# Patient Record
Sex: Female | Born: 1990 | Race: White | Hispanic: No | Marital: Single | Smoking: Never smoker
Health system: Southern US, Community
[De-identification: ages and names within clinical notes are randomized; demographics above are authoritative.]

## PROBLEM LIST (undated history)

## (undated) DIAGNOSIS — Z8619 Personal history of other infectious and parasitic diseases: Secondary | ICD-10-CM

## (undated) DIAGNOSIS — Z789 Other specified health status: Secondary | ICD-10-CM

## (undated) DIAGNOSIS — O009 Unspecified ectopic pregnancy without intrauterine pregnancy: Secondary | ICD-10-CM

## (undated) DIAGNOSIS — O09299 Supervision of pregnancy with other poor reproductive or obstetric history, unspecified trimester: Secondary | ICD-10-CM

## (undated) HISTORY — DX: Supervision of pregnancy with other poor reproductive or obstetric history, unspecified trimester: O09.299

## (undated) HISTORY — DX: Personal history of other infectious and parasitic diseases: Z86.19

## (undated) HISTORY — PX: WISDOM TOOTH EXTRACTION: SHX21

---

## 2012-06-08 ENCOUNTER — Emergency Department: Payer: Self-pay | Admitting: Emergency Medicine

## 2012-06-08 LAB — CBC WITH DIFFERENTIAL/PLATELET
Basophil %: 0.4 %
Eosinophil %: 0.6 %
HCT: 46.7 % (ref 35.0–47.0)
HGB: 16 g/dL (ref 12.0–16.0)
Lymphocyte %: 30.4 %
MCH: 31.6 pg (ref 26.0–34.0)
Neutrophil %: 62.4 %
RBC: 5.06 10*6/uL (ref 3.80–5.20)
RDW: 15.1 % — ABNORMAL HIGH (ref 11.5–14.5)
WBC: 8.5 10*3/uL (ref 3.6–11.0)

## 2012-06-08 LAB — COMPREHENSIVE METABOLIC PANEL
Albumin: 4.5 g/dL (ref 3.8–5.6)
Alkaline Phosphatase: 90 U/L (ref 82–169)
Bilirubin,Total: 0.3 mg/dL (ref 0.2–1.0)
Co2: 26 mmol/L (ref 21–32)
Creatinine: 0.72 mg/dL (ref 0.60–1.30)
EGFR (Non-African Amer.): >60
Osmolality: 285 (ref 275–301)
Potassium: 4 mmol/L (ref 3.5–5.1)
SGPT (ALT): 21 U/L (ref 12–78)
Sodium: 144 mmol/L (ref 136–145)

## 2012-06-08 LAB — WET PREP, GENITAL

## 2012-06-08 LAB — RAPID HIV-1/2 QL/CONFIRM: HIV-1/2,Rapid Ql: NEGATIVE

## 2013-09-11 NOTE — L&D Delivery Note (Signed)
Delivery Note At 9:44 PM a viable and healthy female was delivered via Vaginal, Spontaneous Delivery (Presentation: Right Occiput Anterior).  APGAR: 7, 8; weight 8 lb 13.1 oz (4000 g).   Placenta status: Intact, Spontaneous.  Cord: 3 vessels with the following complications:   Anesthesia: Epidural  Episiotomy: None Lacerations: None Suture Repair: None needed Est. Blood Loss (mL): 200  Mom to postpartum.  Baby to Couplet care / Skin to Skin.  Maurie Boettcherhompson, McKenzie L 08/14/2014, 10:29 PM

## 2013-12-28 ENCOUNTER — Emergency Department: Payer: Self-pay | Admitting: Emergency Medicine

## 2013-12-28 LAB — URINALYSIS, COMPLETE
Bilirubin,UR: NEGATIVE
Blood: NEGATIVE
GLUCOSE, UR: NEGATIVE mg/dL (ref 0–75)
Ketone: NEGATIVE
LEUKOCYTE ESTERASE: NEGATIVE
NITRITE: NEGATIVE
PROTEIN: NEGATIVE
Ph: 7 (ref 4.5–8.0)
RBC,UR: NONE SEEN /HPF (ref 0–5)
Specific Gravity: 1.013 (ref 1.003–1.030)
Squamous Epithelial: 1

## 2013-12-28 LAB — COMPREHENSIVE METABOLIC PANEL
ALK PHOS: 47 U/L
Albumin: 3.7 g/dL (ref 3.4–5.0)
Anion Gap: 4 — ABNORMAL LOW (ref 7–16)
BILIRUBIN TOTAL: 0.3 mg/dL (ref 0.2–1.0)
BUN: 13 mg/dL (ref 7–18)
CALCIUM: 8.8 mg/dL (ref 8.5–10.1)
CO2: 26 mmol/L (ref 21–32)
Chloride: 105 mmol/L (ref 98–107)
Creatinine: 0.49 mg/dL — ABNORMAL LOW (ref 0.60–1.30)
EGFR (African American): >60
EGFR (Non-African Amer.): >60
Glucose: 80 mg/dL (ref 65–99)
Osmolality: 269 (ref 275–301)
Potassium: 3.9 mmol/L (ref 3.5–5.1)
SGOT(AST): 16 U/L (ref 15–37)
SGPT (ALT): 20 U/L (ref 12–78)
SODIUM: 135 mmol/L — AB (ref 136–145)
Total Protein: 7 g/dL (ref 6.4–8.2)

## 2013-12-28 LAB — HCG, QUANTITATIVE, PREGNANCY: Beta Hcg, Quant.: 90452 m[IU]/mL — ABNORMAL HIGH

## 2013-12-28 LAB — CBC
HCT: 39.9 % (ref 35.0–47.0)
HGB: 13.1 g/dL (ref 12.0–16.0)
MCH: 31.6 pg (ref 26.0–34.0)
MCHC: 32.8 g/dL (ref 32.0–36.0)
MCV: 96 fL (ref 80–100)
Platelet: 201 10*3/uL (ref 150–440)
RBC: 4.15 10*6/uL (ref 3.80–5.20)
RDW: 12.7 % (ref 11.5–14.5)
WBC: 9.7 10*3/uL (ref 3.6–11.0)

## 2013-12-28 LAB — GC/CHLAMYDIA PROBE AMP

## 2013-12-28 LAB — LIPASE, BLOOD: Lipase: 102 U/L (ref 73–393)

## 2013-12-28 LAB — WET PREP, GENITAL

## 2014-01-19 LAB — OB RESULTS CONSOLE ABO/RH: RH TYPE: POSITIVE

## 2014-01-19 LAB — OB RESULTS CONSOLE ANTIBODY SCREEN: Antibody Screen: NEGATIVE

## 2014-01-19 LAB — OB RESULTS CONSOLE VARICELLA ZOSTER ANTIBODY, IGG: Varicella: IMMUNE

## 2014-01-19 LAB — OB RESULTS CONSOLE HEPATITIS B SURFACE ANTIGEN: Hepatitis B Surface Ag: NEGATIVE

## 2014-01-19 LAB — OB RESULTS CONSOLE GC/CHLAMYDIA
Chlamydia: NEGATIVE
Gonorrhea: NEGATIVE

## 2014-01-19 LAB — OB RESULTS CONSOLE RPR: RPR: NONREACTIVE

## 2014-01-19 LAB — OB RESULTS CONSOLE HIV ANTIBODY (ROUTINE TESTING): HIV: NONREACTIVE

## 2014-01-19 LAB — OB RESULTS CONSOLE RUBELLA ANTIBODY, IGM: RUBELLA: NON-IMMUNE/NOT IMMUNE

## 2014-01-20 ENCOUNTER — Other Ambulatory Visit (HOSPITAL_COMMUNITY): Payer: Self-pay | Admitting: Physician Assistant

## 2014-01-20 DIAGNOSIS — Z3682 Encounter for antenatal screening for nuchal translucency: Secondary | ICD-10-CM

## 2014-01-27 ENCOUNTER — Other Ambulatory Visit: Payer: Self-pay

## 2014-01-27 ENCOUNTER — Ambulatory Visit (HOSPITAL_COMMUNITY)
Admission: RE | Admit: 2014-01-27 | Discharge: 2014-01-27 | Disposition: A | Discharge status: Home / Self Care | Payer: Medicaid Other | Source: Ambulatory Visit | Attending: Physician Assistant | Admitting: Physician Assistant

## 2014-01-27 ENCOUNTER — Encounter (HOSPITAL_COMMUNITY): Payer: Self-pay

## 2014-01-27 DIAGNOSIS — Z3682 Encounter for antenatal screening for nuchal translucency: Secondary | ICD-10-CM

## 2014-01-27 DIAGNOSIS — Z36 Encounter for antenatal screening of mother: Secondary | ICD-10-CM | POA: Insufficient documentation

## 2014-02-11 ENCOUNTER — Other Ambulatory Visit (HOSPITAL_COMMUNITY): Payer: Self-pay | Admitting: Family

## 2014-02-11 DIAGNOSIS — Z3689 Encounter for other specified antenatal screening: Secondary | ICD-10-CM

## 2014-03-16 ENCOUNTER — Ambulatory Visit (HOSPITAL_COMMUNITY)
Admission: RE | Admit: 2014-03-16 | Discharge: 2014-03-16 | Disposition: A | Discharge status: Home / Self Care | Payer: Medicaid Other | Source: Ambulatory Visit | Attending: Family | Admitting: Family

## 2014-03-16 DIAGNOSIS — Z3689 Encounter for other specified antenatal screening: Secondary | ICD-10-CM | POA: Diagnosis not present

## 2014-03-19 ENCOUNTER — Other Ambulatory Visit (HOSPITAL_COMMUNITY): Payer: Self-pay | Admitting: Nurse Practitioner

## 2014-03-19 DIAGNOSIS — O283 Abnormal ultrasonic finding on antenatal screening of mother: Secondary | ICD-10-CM

## 2014-04-14 ENCOUNTER — Ambulatory Visit (HOSPITAL_COMMUNITY)
Admission: RE | Admit: 2014-04-14 | Discharge: 2014-04-14 | Disposition: A | Discharge status: Home / Self Care | Payer: Medicaid Other | Source: Ambulatory Visit | Attending: Nurse Practitioner | Admitting: Nurse Practitioner

## 2014-04-14 ENCOUNTER — Encounter (HOSPITAL_COMMUNITY): Payer: Self-pay

## 2014-04-14 VITALS — BP 116/73 | HR 73 | Wt 130.0 lb

## 2014-04-14 DIAGNOSIS — O289 Unspecified abnormal findings on antenatal screening of mother: Secondary | ICD-10-CM | POA: Diagnosis present

## 2014-04-14 DIAGNOSIS — IMO0002 Reserved for concepts with insufficient information to code with codable children: Secondary | ICD-10-CM | POA: Diagnosis not present

## 2014-04-14 DIAGNOSIS — O283 Abnormal ultrasonic finding on antenatal screening of mother: Secondary | ICD-10-CM

## 2014-04-16 NOTE — Progress Notes (Signed)
Genetic Counseling  High-Risk Gestation Note  Appointment Date:  04/14/2014 Referred By: Virginia Rochester, NP Date of Birth:  02-28-1991    Pregnancy History: G1P0 Estimated Date of Delivery: 08/07/14 Estimated Gestational Age: 67w4dAttending: [place] MD   I met with Ms. GGibraltarGriffin and her partner for genetic counseling because of abnormal ultrasound findings.  We began by reviewing the ultrasound in detail. Ultrasound performed today visualized the right femur to be bowed and appeared to be shortened compared to the left femur. Bones appeared to have normal echogenicity. Remaining visualized fetal anatomy appeared normal. Complete ultrasound results reported separately.   We discussed that limb differences can occur as isolated, nonsyndromic birth defects/variants, or as features of an underlying genetic syndrome.  The risk for a genetic etiology increases with the presence of multiple fetal anatomic differences; however, the specific risk depends on the constellation of findings.  We reviewed chromosomes, nondisjunction, and the common features of Down syndrome, trisomy 13 and trisomy 138  In addition, we discussed the slight increase in risk for other chromosome aberrations including microdeletions, duplications, insertions, and translocations. Ms. GLanumpreviously had first trimester screening, which reduced the risks for fetal Down syndrome, trisomy 18/13 to less than 1 in 10,000. We also discussed that limb differences with a genetic etiology can be caused by single gene mutations. Specifically, we discussed that a bowed, shortened femur can be a feature of an underlying skeletal dysplasia. We discussed examples of possible skeletal dysplasias that can be associated with shortened and/or bent femurs prenatally including osteogenesis imperfecta, including the wide spectrum and range of severity, as well as femoral hypoplasia with unusual facies. We discussed that genetic or  chromosomal conditions can occur sporadically or be inherited in a variety of ways. Given that the ultrasound finding is unilateral with no additional ultrasound findings, we discussed that an underlying genetic etiology is less likely suspected but cannot be ruled out.   We discussed the availability of prenatal genetic testing via amniocentesis for chromosome analysis and single gene analysis in some cases. We discussed that not all genetic conditions can be detected via amniocentesis. We reviewed the risks, benefits, and limitations of amniocentesis including the 1 in 3416-606risk for complications, including spontaneous preterm delivery.  This couple declined amniocentesis.  We reviewed the availability of a postnatal consult with a medical geneticist to help determine the specific etiology of the described differences, if warranted. They were also counseled that shortened long bones can be a feature in a variety of other genetic conditions, but can also be a normal variant of growth or the result of familial or constitutional short stature.  We discussed that the prognosis and postnatal management depend on the underlying etiology of the shortened bone. We offered the couple a prenatal consultation with pediatric orthopedic surgery, and the couple expressed interest in this consult. We will facilitate a referral to pediatric orthopedic surgery. Follow-up ultrasound is scheduled for 05/26/14.   Both family histories were reviewed in detail and found to be noncontributory for birth defects, intellectual disability, and known genetic conditions- specifically, skeletal dysplasias. Ms. GMcnallyreported a female maternal first cousin who was born with a broken arm. She had very limited information regarding the details of this history and whether or not there were other complications with the pregnancy and/or with labor and delivery. He is currently 21years old and reportedly has broken his arm two more times but  from accidents involving physical trauma to the arm. He is  otherwise healthy. Further genetic counseling is warranted if more information is obtained  Ms. [place] Greenstreet denied exposure to environmental toxins or chemical agents. She denied the use of alcohol, tobacco or street drugs. She denied significant viral illnesses during the course of her pregnancy. Her medical and surgical histories were noncontributory.   I counseled this couple regarding the above risks and available options.  The approximate face-to-face time with the genetic counselor was 25 minutes.  Chipper Oman, MS Certified Genetic Counselor 04/16/2014

## 2014-05-26 ENCOUNTER — Ambulatory Visit (HOSPITAL_COMMUNITY)
Admission: RE | Admit: 2014-05-26 | Discharge: 2014-05-26 | Disposition: A | Discharge status: Home / Self Care | Payer: Medicaid Other | Source: Ambulatory Visit | Attending: Nurse Practitioner | Admitting: Nurse Practitioner

## 2014-05-26 ENCOUNTER — Other Ambulatory Visit (HOSPITAL_COMMUNITY): Payer: Self-pay | Admitting: Maternal and Fetal Medicine

## 2014-05-26 ENCOUNTER — Encounter (HOSPITAL_COMMUNITY): Payer: Self-pay

## 2014-05-26 ENCOUNTER — Other Ambulatory Visit (HOSPITAL_COMMUNITY): Payer: Self-pay | Admitting: Obstetrics and Gynecology

## 2014-05-26 VITALS — BP 123/72 | HR 69 | Wt 140.0 lb

## 2014-05-26 DIAGNOSIS — O283 Abnormal ultrasonic finding on antenatal screening of mother: Secondary | ICD-10-CM

## 2014-05-26 DIAGNOSIS — O358XX0 Maternal care for other (suspected) fetal abnormality and damage, not applicable or unspecified: Secondary | ICD-10-CM

## 2014-05-26 DIAGNOSIS — O289 Unspecified abnormal findings on antenatal screening of mother: Secondary | ICD-10-CM | POA: Insufficient documentation

## 2014-05-26 NOTE — ED Notes (Signed)
Pt reports having lower back pain and 3-4 ctx/day.

## 2014-06-07 ENCOUNTER — Encounter (HOSPITAL_COMMUNITY): Payer: Self-pay

## 2014-07-07 ENCOUNTER — Ambulatory Visit (HOSPITAL_COMMUNITY)
Admission: RE | Admit: 2014-07-07 | Discharge: 2014-07-07 | Disposition: A | Discharge status: Home / Self Care | Payer: Medicaid Other | Source: Ambulatory Visit | Attending: Nurse Practitioner | Admitting: Nurse Practitioner

## 2014-07-07 ENCOUNTER — Encounter (HOSPITAL_COMMUNITY): Payer: Self-pay

## 2014-07-07 VITALS — BP 115/79 | HR 77 | Wt 147.5 lb

## 2014-07-07 DIAGNOSIS — O289 Unspecified abnormal findings on antenatal screening of mother: Secondary | ICD-10-CM | POA: Diagnosis not present

## 2014-07-07 DIAGNOSIS — O283 Abnormal ultrasonic finding on antenatal screening of mother: Secondary | ICD-10-CM

## 2014-07-07 DIAGNOSIS — Z3A35 35 weeks gestation of pregnancy: Secondary | ICD-10-CM | POA: Insufficient documentation

## 2014-07-13 ENCOUNTER — Encounter (HOSPITAL_COMMUNITY): Payer: Self-pay

## 2014-07-13 LAB — OB RESULTS CONSOLE GC/CHLAMYDIA
Chlamydia: NEGATIVE
Gonorrhea: NEGATIVE

## 2014-07-13 LAB — OB RESULTS CONSOLE GBS: GBS: NEGATIVE

## 2014-08-10 ENCOUNTER — Other Ambulatory Visit (HOSPITAL_COMMUNITY): Payer: Self-pay | Admitting: Nurse Practitioner

## 2014-08-10 DIAGNOSIS — O480 Post-term pregnancy: Secondary | ICD-10-CM

## 2014-08-11 ENCOUNTER — Encounter (HOSPITAL_COMMUNITY): Payer: Self-pay | Admitting: *Deleted

## 2014-08-11 ENCOUNTER — Telehealth (HOSPITAL_COMMUNITY): Payer: Self-pay | Admitting: *Deleted

## 2014-08-11 NOTE — Telephone Encounter (Signed)
Preadmission screen  

## 2014-08-12 ENCOUNTER — Inpatient Hospital Stay (HOSPITAL_COMMUNITY)
Admission: AD | Admit: 2014-08-12 | Discharge: 2014-08-12 | Disposition: A | Discharge status: Home / Self Care | Payer: Medicaid Other | Source: Ambulatory Visit | Attending: Obstetrics & Gynecology | Admitting: Obstetrics & Gynecology

## 2014-08-12 ENCOUNTER — Encounter (HOSPITAL_COMMUNITY): Payer: Self-pay | Admitting: *Deleted

## 2014-08-12 DIAGNOSIS — Z3A40 40 weeks gestation of pregnancy: Secondary | ICD-10-CM | POA: Insufficient documentation

## 2014-08-12 DIAGNOSIS — O479 False labor, unspecified: Secondary | ICD-10-CM

## 2014-08-12 DIAGNOSIS — O471 False labor at or after 37 completed weeks of gestation: Secondary | ICD-10-CM | POA: Diagnosis present

## 2014-08-12 LAB — POCT FERN TEST

## 2014-08-12 NOTE — MAU Note (Signed)
C/o contractions and ?SROM; ?SROM @ midnight last night; contractions started Monday night @ 2100;

## 2014-08-12 NOTE — Discharge Instructions (Signed)

## 2014-08-12 NOTE — MAU Note (Signed)
Patient states she started leaking clear fluid at 0000, this am has had a slight pink tinge to it and states she had a gush with turning over. Not leaking at this time and not wearing a pad. States contractions every 7 minutes. Reports good fetal movement.

## 2014-08-12 NOTE — MAU Provider Note (Signed)
S: 23 y.o. G1P0 @[redacted]w[redacted]d  pt of [facility] presents to MAU for labor evaluation.  She reports leakage of clear fluid, enough to soak her underwear, but not requiring a pad.  The first episode was 4 days ago, then again today.  She reports good fetal movement, denies vaginal bleeding, vaginal itching/burning, urinary symptoms, h/a, dizziness, n/v, or fever/chills.    O: BP 120/86 mmHg  Pulse 97  Temp(Src) 98.4 F (36.9 C) (Oral)  Resp 16  Ht 5' (1.524 m)  Wt 70.081 kg (154 lb 8 oz)  BMI 30.17 kg/m2  LMP 10/31/2013   Physical Examination: General appearance - alert, well appearing, and in no distress and acyanotic, in no respiratory distress  Speculum exam with negative pooling, negative ferning on slide collected  Dilation: 1 Effacement (%): 70 Station: -3 Presentation: Vertex Exam by:: L Leftwich-Kirby CNM   A: Threatened labor at term  P: D/C home with labor precautions Keep scheduled appts at office  Sharen CounterLisa Leftwich-Kirby Certified Nurse-Midwife

## 2014-08-13 ENCOUNTER — Inpatient Hospital Stay (HOSPITAL_COMMUNITY)
Admission: AD | Admit: 2014-08-13 | Discharge: 2014-08-17 | DRG: 775 | Disposition: A | Discharge status: Home / Self Care | Payer: Medicaid Other | Source: Ambulatory Visit | Attending: Obstetrics & Gynecology | Admitting: Obstetrics & Gynecology

## 2014-08-13 ENCOUNTER — Encounter (HOSPITAL_COMMUNITY): Payer: Self-pay

## 2014-08-13 ENCOUNTER — Ambulatory Visit (HOSPITAL_COMMUNITY)
Admission: RE | Admit: 2014-08-13 | Discharge: 2014-08-13 | Disposition: A | Discharge status: Home / Self Care | Payer: Medicaid Other | Source: Ambulatory Visit | Attending: Nurse Practitioner | Admitting: Nurse Practitioner

## 2014-08-13 DIAGNOSIS — Z3A40 40 weeks gestation of pregnancy: Secondary | ICD-10-CM | POA: Diagnosis present

## 2014-08-13 DIAGNOSIS — Z818 Family history of other mental and behavioral disorders: Secondary | ICD-10-CM

## 2014-08-13 DIAGNOSIS — O358XX0 Maternal care for other (suspected) fetal abnormality and damage, not applicable or unspecified: Secondary | ICD-10-CM | POA: Diagnosis present

## 2014-08-13 DIAGNOSIS — Z8249 Family history of ischemic heart disease and other diseases of the circulatory system: Secondary | ICD-10-CM

## 2014-08-13 DIAGNOSIS — O480 Post-term pregnancy: Principal | ICD-10-CM | POA: Diagnosis present

## 2014-08-13 DIAGNOSIS — O35DXX0 Maternal care for other (suspected) fetal abnormality and damage, fetal gastrointestinal anomalies, not applicable or unspecified: Secondary | ICD-10-CM | POA: Insufficient documentation

## 2014-08-13 DIAGNOSIS — IMO0001 Reserved for inherently not codable concepts without codable children: Secondary | ICD-10-CM

## 2014-08-13 HISTORY — DX: Other specified health status: Z78.9

## 2014-08-13 LAB — CBC
HCT: 35.9 % — ABNORMAL LOW (ref 36.0–46.0)
Hemoglobin: 12.4 g/dL (ref 12.0–15.0)
MCH: 31.3 pg (ref 26.0–34.0)
MCHC: 34.5 g/dL (ref 30.0–36.0)
MCV: 90.7 fL (ref 78.0–100.0)
PLATELETS: 180 10*3/uL (ref 150–400)
RBC: 3.96 MIL/uL (ref 3.87–5.11)
RDW: 13.6 % (ref 11.5–15.5)
WBC: 9.5 10*3/uL (ref 4.0–10.5)

## 2014-08-13 LAB — ABO/RH: ABO/RH(D): A POS

## 2014-08-13 LAB — RPR

## 2014-08-13 LAB — TYPE AND SCREEN
ABO/RH(D): A POS
ANTIBODY SCREEN: NEGATIVE

## 2014-08-13 LAB — RAPID HIV SCREEN (WH-MAU): Rapid HIV Screen: NONREACTIVE

## 2014-08-13 MED ORDER — LACTATED RINGERS IV SOLN
INTRAVENOUS | Status: DC
  Administered 2014-08-13 (×2): via INTRAVENOUS
  Administered 2014-08-14: 125 mL/h via INTRAVENOUS
  Administered 2014-08-14 (×2): via INTRAVENOUS

## 2014-08-13 MED ORDER — CITRIC ACID-SODIUM CITRATE 334-500 MG/5ML PO SOLN: 30.0000 mL | ORAL | Status: DC | PRN

## 2014-08-13 MED ORDER — PHENYLEPHRINE 40 MCG/ML (10ML) SYRINGE FOR IV PUSH (FOR BLOOD PRESSURE SUPPORT)
80.0000 ug | PREFILLED_SYRINGE | INTRAVENOUS | Status: DC | PRN
  Filled 2014-08-13: qty 10
  Filled 2014-08-13: qty 2

## 2014-08-13 MED ORDER — OXYCODONE-ACETAMINOPHEN 5-325 MG PO TABS
1.0000 | ORAL_TABLET | ORAL | Status: DC | PRN
  Administered 2014-08-14: 1 via ORAL
  Filled 2014-08-13 (×2): qty 1

## 2014-08-13 MED ORDER — OXYTOCIN BOLUS FROM INFUSION
500.0000 mL | INTRAVENOUS | Status: DC
  Administered 2014-08-14: 500 mL via INTRAVENOUS

## 2014-08-13 MED ORDER — OXYTOCIN 40 UNITS IN LACTATED RINGERS INFUSION - SIMPLE MED
1.0000 m[IU]/min | INTRAVENOUS | Status: DC
  Administered 2014-08-13: 1 m[IU]/min via INTRAVENOUS

## 2014-08-13 MED ORDER — EPHEDRINE 5 MG/ML INJ
10.0000 mg | INTRAVENOUS | Status: DC | PRN
  Filled 2014-08-13: qty 2

## 2014-08-13 MED ORDER — TERBUTALINE SULFATE 1 MG/ML IJ SOLN: 0.2500 mg | Freq: Once | INTRAMUSCULAR | Status: AC | PRN

## 2014-08-13 MED ORDER — OXYTOCIN 40 UNITS IN LACTATED RINGERS INFUSION - SIMPLE MED
62.5000 mL/h | INTRAVENOUS | Status: DC
  Administered 2014-08-14: 62.5 mL/h via INTRAVENOUS
  Filled 2014-08-13: qty 1000

## 2014-08-13 MED ORDER — MISOPROSTOL 200 MCG PO TABS: 50.0000 ug | ORAL_TABLET | ORAL | Status: DC | PRN

## 2014-08-13 MED ORDER — ZOLPIDEM TARTRATE 5 MG PO TABS: 5.0000 mg | ORAL_TABLET | Freq: Every evening | ORAL | Status: DC | PRN

## 2014-08-13 MED ORDER — ONDANSETRON HCL 4 MG/2ML IJ SOLN
4.0000 mg | Freq: Four times a day (QID) | INTRAMUSCULAR | Status: DC | PRN
  Administered 2014-08-13 – 2014-08-14 (×3): 4 mg via INTRAVENOUS
  Filled 2014-08-13 (×3): qty 2

## 2014-08-13 MED ORDER — LIDOCAINE HCL (PF) 1 % IJ SOLN
30.0000 mL | INTRAMUSCULAR | Status: DC | PRN
  Filled 2014-08-13 (×2): qty 30

## 2014-08-13 MED ORDER — FENTANYL CITRATE 0.05 MG/ML IJ SOLN
100.0000 ug | INTRAMUSCULAR | Status: DC | PRN
Start: 2014-08-13 — End: 2014-08-15
  Administered 2014-08-13 – 2014-08-14 (×6): 100 ug via INTRAVENOUS
  Filled 2014-08-13 (×6): qty 2

## 2014-08-13 MED ORDER — FLEET ENEMA 7-19 GM/118ML RE ENEM: 1.0000 | ENEMA | RECTAL | Status: DC | PRN

## 2014-08-13 MED ORDER — LACTATED RINGERS IV SOLN
500.0000 mL | INTRAVENOUS | Status: DC | PRN
  Administered 2014-08-14: 500 mL via INTRAVENOUS

## 2014-08-13 MED ORDER — DIPHENHYDRAMINE HCL 50 MG/ML IJ SOLN
12.5000 mg | INTRAMUSCULAR | Status: DC | PRN
  Administered 2014-08-14: 12.5 mg via INTRAVENOUS
  Filled 2014-08-13: qty 1

## 2014-08-13 MED ORDER — LACTATED RINGERS IV SOLN: 500.0000 mL | Freq: Once | INTRAVENOUS | Status: DC

## 2014-08-13 MED ORDER — OXYCODONE-ACETAMINOPHEN 5-325 MG PO TABS
2.0000 | ORAL_TABLET | ORAL | Status: DC | PRN
Start: 2014-08-13 — End: 2014-08-15
  Filled 2014-08-13: qty 2

## 2014-08-13 MED ORDER — FENTANYL 2.5 MCG/ML BUPIVACAINE 1/10 % EPIDURAL INFUSION (WH - ANES)
14.0000 mL/h | INTRAMUSCULAR | Status: DC | PRN
  Administered 2014-08-14 (×4): 14 mL/h via EPIDURAL
  Filled 2014-08-13 (×4): qty 125

## 2014-08-13 MED ORDER — ACETAMINOPHEN 325 MG PO TABS
650.0000 mg | ORAL_TABLET | ORAL | Status: DC | PRN
  Administered 2014-08-14 (×2): 650 mg via ORAL
  Filled 2014-08-13 (×2): qty 2

## 2014-08-13 NOTE — Progress Notes (Signed)
   Olivia Obrien is a 23 y.o. G1P0 at 339w6d  admitted for induction of labor due to Post dates/enlarged fetal bowel.  Subjective: No pain/contractions  Objective: Filed Vitals:   08/13/14 1352 08/13/14 1527 08/13/14 1847 08/13/14 1848  BP: 126/85 120/73  133/85  Pulse: 79 74  96  Temp:  98.5 F (36.9 C) 97.9 F (36.6 C)   TempSrc:  Oral Oral   Resp: 18 18 18    Height:      Weight:          FHT:  FHR: 135 bpm, variability: moderate,  accelerations:  Present,  decelerations:  Absent UC:   irregular, every 5-8 minutes SVE:   Dilation: 6 Effacement (%): 80 Station: -2 Exam by:: Amy Beard RNC-OB, BSN  Foley fell out a short time ago  Labs: Lab Results  Component Value Date   WBC 9.5 08/13/2014   HGB 12.4 08/13/2014   HCT 35.9* 08/13/2014   MCV 90.7 08/13/2014   PLT 180 08/13/2014    Assessment / Plan: IOL, ripening phase complete  Labor: Progressing normally Fetal Wellbeing:  Category I Pain Control:  Fentanyl Anticipated MOD:  NSVD  CRESENZO-DISHMAN,Olivia Obrien 08/13/2014, 7:34 PM

## 2014-08-13 NOTE — H&P (Signed)
[place] Judie PetitM Valentina LucksGriffin is a 23 y.o. female G1P0 at 7064w6d by LMP and 8wk US presenting for IOL for fetal dilated bowel.  Patient seen today for BPP for postdates.  BPP 8/8 with reactive NST but found dilated bowel.  She has had no complications during pregnancy.  Was scheduled for IOL on 12/7 for postdates, but after dilated bowel found on BPP today, decision made to induce today.  +FM, no VB, contractions.  + LOF since Saturday that increased yesterday.  Having baby girl, plans to breastfeed, undecided about contraception.   Maternal Medical History:  Reason for admission: Nausea.    OB History    Gravida Para Term Preterm AB TAB SAB Ectopic Multiple Living   1         0     Past Medical History  Diagnosis Date  . Hx of chlamydia infection    Past Surgical History  Procedure Laterality Date  . Wisdom tooth extraction     Family History: family history includes Depression in her father and mother; Heart disease in her father; Hypertension in her mother; Seizures in her father; Tuberculosis in her father. Social History:  reports that she has never smoked. She does not have any smokeless tobacco history on file. She reports that she does not drink alcohol or use illicit drugs.    Review of Systems  Constitutional: Negative for fever, chills and weight loss.  Eyes: Negative for blurred vision.  Respiratory: Negative for cough and shortness of breath.   Cardiovascular: Negative for chest pain and leg swelling.  Gastrointestinal: Negative for heartburn, nausea, vomiting and abdominal pain.  Genitourinary: Negative for dysuria.  Skin: Negative for itching and rash.  Neurological: Negative for headaches.      Last menstrual period 10/31/2013.   Cervix: 1/40/-3 (Dr. Loreta AveAcosta)  Maternal Exam:  Uterine Assessment: Contraction strength is mild.  Contraction frequency is irregular.   Abdomen: Patient reports no abdominal tenderness. Fetal presentation: vertex  Introitus: Ferning test:  not done.  Nitrazine test: not done. Amniotic fluid character: not assessed.  Pelvis: adequate for delivery.   Cervix: Cervix evaluated by digital exam.     Fetal Exam Fetal Monitor Review: Baseline rate: 135.  Variability: moderate (6-25 bpm).   Pattern: accelerations present and no decelerations.    Fetal State Assessment: Category I - tracings are normal.     Physical Exam  Constitutional: She is oriented to person, place, and time. She appears well-developed and well-nourished. No distress.  HENT:  Head: Normocephalic and atraumatic.  Cardiovascular: Normal rate and intact distal pulses.   Respiratory: Effort normal. No respiratory distress.  Musculoskeletal: She exhibits no edema or tenderness.  Neurological: She is alert and oriented to person, place, and time.  Skin: Skin is warm and dry.    Prenatal labs: ABO, Rh: A/Positive/-- (05/11 0000) Antibody: Negative (05/11 0000) Rubella: Nonimmune (05/11 0000) RPR: Nonreactive (05/11 0000)  HBsAg: Negative (05/11 0000)  HIV: Non-reactive (05/11 0000)  GBS: Negative (11/02 0000)   Assessment/Plan: [place] M Conover is a 23 y.o. G1P0 at 7264w6d here for IOL for fetal dilated bowel found on BPP today  #Labor:IOL with foley bulb placement and cytotec.  Will likely augment contractions later with pitocin #Pain: Plans for IV fentanyl.  Can have epidural if desired #FWB: Cat 1, reactive, accels present #ID:  GBS negative #MOF: breast #MOC: considering OCPs/Nexplanon/IUD, undecided #Circ:  N/a, female    Shirlee LatchBacigalupo, Angela 08/13/2014, 9:17 AM    OB fellow attestation:  I  have seen and examined this patient; I agree with above documentation in the resident's note.   [place] Judie PetitM Valentina LucksGriffin is a 23 y.o. G1P0 reporting for IOL 2/2 dilated loops of bowel on BPP today  PE: BP 126/85 mmHg  Pulse 79  Temp(Src) 98.3 F (36.8 C) (Oral)  Resp 18  Ht 5' (1.524 m)  Wt 155 lb (70.308 kg)  BMI 30.27 kg/m2  LMP 10/31/2013 Gen:  calm comfortable, NAD Resp: normal effort, no distress Abd: gravid Cephalic presentation confirmed by me today with sono  ROS, labs, PMH reviewed  Plan: - cytotec and FB for induction, FB placed by me at 1145  Perry MountACOSTA,Sabrin Dunlevy ROCIO, MD 2:12 PM

## 2014-08-13 NOTE — Progress Notes (Signed)
   Olivia Obrien is a 23 y.o. G1P0 at 1943w6d  admitted for induction of labor due to Post dates.and dilated fetal bowel.  Subjective: Doing well with IV meds.  Ctx are getting stronger  Objective: Filed Vitals:   08/13/14 1848 08/13/14 1959 08/13/14 2110 08/13/14 2130  BP: 133/85 130/88    Pulse: 96 88    Temp:  98.4 F (36.9 C)    TempSrc:  Oral    Resp:  20 18 18   Height:      Weight:          FHT:  FHR: 130 bpm, variability: moderate,  accelerations:  Present,  decelerations:  Absent UC:   irregular, every 2-4 minutes SVE:   Dilation: 6 Effacement (%): 80 Station: -2 Exam by:: ChiropractorBass RN Pitocin @ 3 mu/min SROM with clear fluid aroung 2100  Labs: Lab Results  Component Value Date   WBC 9.5 08/13/2014   HGB 12.4 08/13/2014   HCT 35.9* 08/13/2014   MCV 90.7 08/13/2014   PLT 180 08/13/2014    Assessment / Plan: Induction of labor due to postterm,  progressing well on pitocin  Labor: Progressing normally Fetal Wellbeing:  Category I Pain Control:  Fentanyl Anticipated MOD:  NSVD  CRESENZO-DISHMAN,Ming Kunka 08/13/2014, 9:49 PM

## 2014-08-14 ENCOUNTER — Inpatient Hospital Stay (HOSPITAL_COMMUNITY): Payer: Medicaid Other | Admitting: Anesthesiology

## 2014-08-14 ENCOUNTER — Encounter (HOSPITAL_COMMUNITY): Payer: Self-pay

## 2014-08-14 DIAGNOSIS — Z3A40 40 weeks gestation of pregnancy: Secondary | ICD-10-CM

## 2014-08-14 DIAGNOSIS — O480 Post-term pregnancy: Secondary | ICD-10-CM

## 2014-08-14 DIAGNOSIS — O358XX0 Maternal care for other (suspected) fetal abnormality and damage, not applicable or unspecified: Secondary | ICD-10-CM

## 2014-08-14 MED ORDER — LIDOCAINE HCL (PF) 1 % IJ SOLN
INTRAMUSCULAR | Status: DC | PRN
  Administered 2014-08-14 (×2): 5 mL

## 2014-08-14 MED ORDER — DEXTROSE 5 % IV SOLN
130.0000 mg | Freq: Two times a day (BID) | INTRAVENOUS | Status: DC
  Administered 2014-08-14: 130 mg via INTRAVENOUS
  Filled 2014-08-14 (×2): qty 3.25

## 2014-08-14 MED ORDER — IBUPROFEN 600 MG PO TABS
600.0000 mg | ORAL_TABLET | Freq: Four times a day (QID) | ORAL | Status: DC
  Administered 2014-08-15 – 2014-08-17 (×11): 600 mg via ORAL
  Filled 2014-08-14 (×8): qty 1

## 2014-08-14 MED ORDER — GENTAMICIN SULFATE 40 MG/ML IJ SOLN
140.0000 mg | Freq: Once | INTRAVENOUS | Status: AC
  Filled 2014-08-14: qty 3.5

## 2014-08-14 MED ORDER — BUPIVACAINE HCL (PF) 0.25 % IJ SOLN
INTRAMUSCULAR | Status: DC | PRN
  Administered 2014-08-14 (×2): 5 mL

## 2014-08-14 MED ORDER — SODIUM BICARBONATE 8.4 % IV SOLN
INTRAVENOUS | Status: DC | PRN
  Administered 2014-08-14 (×4): 4 mL via EPIDURAL

## 2014-08-14 MED ORDER — SODIUM CHLORIDE 0.9 % IV SOLN
2.0000 g | Freq: Four times a day (QID) | INTRAVENOUS | Status: DC
  Administered 2014-08-14 (×2): 2 g via INTRAVENOUS
  Filled 2014-08-14 (×5): qty 2000

## 2014-08-14 NOTE — Progress Notes (Signed)
[place] Jud[place] PetitM Valentina LucksGriffin is a 23 y.o. G1P0 at 7573w0d admitted for induction of labor due to fetal anomaly.  Subjective: Sleeping quietly, denies any pain or pressure.  Objective: BP 106/74 mmHg  Pulse 110  Temp(Src) 100.2 F (37.9 C) (Axillary)  Resp 22  Ht 5' (1.524 m)  Wt 155 lb (70.308 kg)  BMI 30.27 kg/m2  LMP 10/31/2013 Total I/O In: -  Out: 450 [Urine:450]  FHT:  FHR: 150 bpm, variability: minimal ,  accelerations:  Present,  decelerations:  Present early decelerations UC:   regular, every 2-3 minutes  SVE:   Dilation: 8.5 Effacement (%): 70, 80 Station: +1 Exam by:: EconomistAdams CNM student  Pitocin @ 4 mu/min  Labs: Lab Results  Component Value Date   WBC 9.5 08/13/2014   HGB 12.4 08/13/2014   HCT 35.9* 08/13/2014   MCV 90.7 08/13/2014   PLT 180 08/13/2014    Assessment / Plan: Protracted active phase  Labor: active labor progressing slowly Fetal Wellbeing:  Category II Pain Control:  Epidural Pre-eclampsia: n/a  I/D:  n/a Anticipated MOD:  NSVD  Maijor Hornig SHWON Student NM 08/14/2014, 1:50 PM

## 2014-08-14 NOTE — Plan of Care (Signed)
Problem: Phase I Progression Outcomes Goal: Medical plan of care initiated within 2 hrs of admission Outcome: Completed/Met Date Met:  08/14/14     

## 2014-08-14 NOTE — Plan of Care (Signed)
Problem: Phase I Progression Outcomes Goal: Assess/evaluate notify MD when complete/8 cm Outcome: Completed/Met Date Met:  08/14/14

## 2014-08-14 NOTE — Anesthesia Preprocedure Evaluation (Signed)

## 2014-08-14 NOTE — Progress Notes (Signed)
Olivia Obrien is a 23 y.o. G1P0 at 6765w0d admitted for induction of labor due to fetal anomaly.  Subjective: "I feel so much pressure and can't resist the urge to push"  Objective: BP 112/60 mmHg  Pulse 117  Temp(Src) 99.1 F (37.3 C) (Oral)  Resp 55  Ht 5' (1.524 m)  Wt 155 lb (70.308 kg)  BMI 30.27 kg/m2  LMP 10/31/2013 Total I/O In: -  Out: 450 [Urine:450]  FHT:  FHR: 155 bpm, variability: moderate,  accelerations:  Present,  decelerations:  Absent UC:   regular, every 2-3 minutes  SVE:   Dilation: Lip/rim Effacement (%): 90 Station: -1 Exam by:: S. Brock Mokry CNM  Pitocin @ 4 mu/min  Labs: Lab Results  Component Value Date   WBC 9.5 08/13/2014   HGB 12.4 08/13/2014   HCT 35.9* 08/13/2014   MCV 90.7 08/13/2014   PLT 180 08/13/2014    Assessment / Plan: Induction of labor due to fetal demise,  progressing well on pitocin Anesthesia to give bolus Labor: Active labor Fetal Wellbeing:  Category I Pain Control:  Epidural Pre-eclampsia: N/A I/D:  N/A Anticipated MOD:  NSVD  Olivia Obrien SHWON Student NM 08/14/2014, 4:16 PM

## 2014-08-14 NOTE — Plan of Care (Signed)
Problem: Phase I Progression Outcomes Goal: Pain controlled with appropriate interventions Outcome: Not Met (add Reason) Has had epidural redosed x 3

## 2014-08-14 NOTE — Progress Notes (Signed)
ANTIBIOTIC CONSULT NOTE - INITIAL  Pharmacy Consult for Gentamicin Indication: Chorioamnionitis  Allergies  Allergen Reactions  . Bee Venom Swelling    At the site of sting    Patient Measurements: Height: 5' (152.4 cm) Weight: 155 lb (70.308 kg) IBW/kg (Calculated) : 45.5 Adjusted Body Weight:   Vital Signs: Temp: 100.5 F (38.1 C) (12/04 1500) Temp Source: Oral (12/04 1500) BP: 116/70 mmHg (12/04 1500) Pulse Rate: 128 (12/04 1430) Intake/Output from previous day: Intake/Output from this shift: Total I/O In: -  Out: 450 [Urine:450]  Labs:  Recent Labs  08/13/14 1000  WBC 9.5  HGB 12.4  PLT 180   CrCl cannot be calculated (Patient has no serum creatinine result on file.). No results for input(s): VANCOTROUGH, VANCOPEAK, VANCORANDOM, GENTTROUGH, GENTPEAK, GENTRANDOM, TOBRATROUGH, TOBRAPEAK, TOBRARND, AMIKACINPEAK, AMIKACINTROU, AMIKACIN in the last 72 hours.   Microbiology: No results found for this or any previous visit (from the past 720 hour(s)).  Medical History: Past Medical History  Diagnosis Date  . Hx of chlamydia infection   . Medical history non-contributory     Medications: Ampicillin 2g IV q 6 Hours  Assessment:  Chorioamnionitis   Goal of Therapy: Peak~ 6-7mg /L,  Trough < 1 mg/L  Plan:  1)Load Gentamicin 140mg  IV, 2)Maintenance Gentamicin 130mg  IV Q12  Hovey-Rankin, Nolie Bignell 08/14/2014,3:12 PM

## 2014-08-14 NOTE — Progress Notes (Signed)
   [place] Judie PetitM Valentina LucksGriffin is a 23 y.o. G1P0 at 117w6d  admitted for induction of labor due to Post dates.and dilated fetal bowel.  Subjective: Doing well with IV meds.  Ctx are getting stronger  Objective: Filed Vitals:   08/13/14 2200 08/13/14 2230 08/13/14 2300 08/13/14 2330  BP: 114/81 111/73  130/86  Pulse: 76 69 82 96  Temp: 98.1 F (36.7 C)     TempSrc: Oral     Resp: 16 20 20 18   Height:      Weight:          FHT: 120, avg LTV, frequent accels, no decels UC:   irregular, every 1-2 minutes SVE:   Dilation: 6 Effacement (%): 80 Station: -2 Exam by:: Drenda FreezeFran CNM Pitocin @ 4 mu/min   Labs: Lab Results  Component Value Date   WBC 9.5 08/13/2014   HGB 12.4 08/13/2014   HCT 35.9* 08/13/2014   MCV 90.7 08/13/2014   PLT 180 08/13/2014    Assessment / Plan: Induction of labor due to postterm,  progressing well on pitocin  Labor: Progressing normally Fetal Wellbeing:  Category I Pain Control:  Fentanyl Anticipated MOD:  NSVD  CRESENZO-DISHMAN,Joan Avetisyan 08/14/2014, 12:01 AM

## 2014-08-14 NOTE — Plan of Care (Signed)
Problem: Phase I Progression Outcomes Goal: Assess per MD/Nurse,Routine-VS,FHR,UC,Head to Toe assess Outcome: Completed/Met Date Met:  08/14/14 Goal: Obtain and review prenatal records Outcome: Completed/Met Date Met:  08/14/14 Goal: Pain controlled with appropriate interventions Outcome: Completed/Met Date Met:  08/14/14 Goal: OOB as tolerated unless otherwise ordered Outcome: Completed/Met Date Met:  08/14/14 Goal: Tolerating diet Outcome: Completed/Met Date Met:  08/14/14 Goal: Adequate progression of labor Outcome: Completed/Met Date Met:  08/14/14 Goal: Medications/IV Fluids N/A Outcome: Completed/Met Date Met:  08/14/14 Goal: Induction meds as ordered Outcome: Completed/Met Date Met:  08/14/14 Goal: IV Pain medications as ordered Outcome: Completed/Met Date Met:  08/14/14 Goal: Pitocin as ordered Outcome: Completed/Met Date Met:  08/14/14 Goal: FHR checked 5 minutes after meds (ROM) Rupture of Membranes Outcome: Completed/Met Date Met:  08/14/14 Goal: Assess/evaluate labor progress and adequacy Outcome: Completed/Met Date Met:  08/14/14 Goal: Assess/evaluate cervical exam prn (q2hrs in active phase) Outcome: Completed/Met Date Met:  08/14/14

## 2014-08-14 NOTE — Progress Notes (Signed)
   [place] Judie PetitM Valentina LucksGriffin is a 23 y.o. G1P0 at 315w0d  admitted for induction of labor due to Post dates. And dilated fetal bowel.  Subjective: Comfortable with epidural.  Feeling some pressure  Objective: Filed Vitals:   08/14/14 0530 08/14/14 0549 08/14/14 0600 08/14/14 0624  BP: 110/63  122/78   Pulse: 89  87   Temp:  99.8 F (37.7 C)  99.3 F (37.4 C)  TempSrc:  Oral  Oral  Resp: 18  18   Height:      Weight:          FHT:  FHR: 135 bpm, variability: moderate,  accelerations:  Present,  decelerations:  Absent UC:   IUPC places after AROM of forebag.  MUV's ~ 200 SVE:   Dilation: 7 Effacement (%): 90 Station: -1 Exam by:: Drenda FreezeFran CNM Pitocin @ 4 mu/min  Labs: Lab Results  Component Value Date   WBC 9.5 08/13/2014   HGB 12.4 08/13/2014   HCT 35.9* 08/13/2014   MCV 90.7 08/13/2014   PLT 180 08/13/2014    Assessment / Plan: Induction of labor due to postterm,  progressing well on pitocin  Labor: Progressing normally Fetal Wellbeing:  Category I Pain Control:  Epidural Anticipated MOD:  NSVD  CRESENZO-DISHMAN,Analyce Tavares 08/14/2014, 6:28 AM

## 2014-08-14 NOTE — Progress Notes (Signed)
Notified by RN that patient has temp of 101.0.  New orders given for Ampicillin 2 grams IV every 6hrs and Gentamycin per pharmacy.  Will con't to monitor.  Tylea Hise SHWON 08/14/2014 2:24 PM

## 2014-08-14 NOTE — Anesthesia Procedure Notes (Signed)
Epidural Patient location during procedure: OB Start time: 08/14/2014 1:59 AM  Staffing Anesthesiologist: Brayton CavesJACKSON, Elida Harbin Performed by: anesthesiologist   Preanesthetic Checklist Completed: patient identified, site marked, surgical consent, pre-op evaluation, timeout performed, IV checked, risks and benefits discussed and monitors and equipment checked  Epidural Patient position: sitting Prep: site prepped and draped and DuraPrep Patient monitoring: continuous pulse ox and blood pressure Approach: midline Location: L3-L4 Injection technique: LOR air  Needle:  Needle type: Tuohy  Needle gauge: 17 G Needle length: 9 cm and 9 Needle insertion depth: 6 cm Catheter type: closed end flexible Catheter size: 19 Gauge Catheter at skin depth: 11 cm Test dose: negative  Assessment Events: blood not aspirated, injection not painful, no injection resistance, negative IV test and no paresthesia  Additional Notes Patient identified.  Risk benefits discussed including failed block, incomplete pain control, headache, nerve damage, paralysis, blood pressure changes, nausea, vomiting, reactions to medication both toxic or allergic, and postpartum back pain.  Patient expressed understanding and wished to proceed.  All questions were answered.  Sterile technique used throughout procedure and epidural site dressed with sterile barrier dressing. No paresthesia or other complications noted.The patient did not experience any signs of intravascular injection such as tinnitus or metallic taste in mouth nor signs of intrathecal spread such as rapid motor block. Please see nursing notes for vital signs.

## 2014-08-14 NOTE — Progress Notes (Signed)
Olivia Obrien is a 23 y.o. G1P0 at 6551w0d admitted for induction of labor due to fetal dilated bowel.  Subjective: Resting quietly and comfortably, reports some pressure but wishes to defer cervical exam until she has the urge to push.  Objective: BP 124/77 mmHg  Pulse 91  Temp(Src) 99.3 F (37.4 C) (Oral)  Resp 16  Ht 5' (1.524 m)  Wt 155 lb (70.308 kg)  BMI 30.27 kg/m2  LMP 10/31/2013    FHT:  FHR: 140 bpm, variability: moderate,  accelerations:  Present,  decelerations:  Absent UC:   regular, every 2-3 minutes  SVE:   Dilation: 7 Effacement (%): 90 Station: -1 Exam by:: Drenda FreezeFran CNM  Pitocin @ 4 mu/min  Labs: Lab Results  Component Value Date   WBC 9.5 08/13/2014   HGB 12.4 08/13/2014   HCT 35.9* 08/13/2014   MCV 90.7 08/13/2014   PLT 180 08/13/2014    Assessment / Plan: Induction of labor due to fetal anomaly,  progressing well on pitocin  Labor: Progressing well on Pitocin, will continue current plan of care. Fetal Wellbeing:  Category I Pain Control:  Epidural Pre-eclampsia: N/A  I/D:  N/A Anticipated MOD:  NSVD  Jahseh Lucchese SHWON Student NM 08/14/2014, 9:10 AM

## 2014-08-15 LAB — CBC
HEMATOCRIT: 32.3 % — AB (ref 36.0–46.0)
HEMOGLOBIN: 11 g/dL — AB (ref 12.0–15.0)
MCH: 31.3 pg (ref 26.0–34.0)
MCHC: 34.1 g/dL (ref 30.0–36.0)
MCV: 91.8 fL (ref 78.0–100.0)
Platelets: 164 10*3/uL (ref 150–400)
RBC: 3.52 MIL/uL — ABNORMAL LOW (ref 3.87–5.11)
RDW: 14.1 % (ref 11.5–15.5)
WBC: 32.3 10*3/uL — AB (ref 4.0–10.5)

## 2014-08-15 MED ORDER — ONDANSETRON HCL 4 MG PO TABS: 4.0000 mg | ORAL_TABLET | ORAL | Status: DC | PRN

## 2014-08-15 MED ORDER — IBUPROFEN 600 MG PO TABS
ORAL_TABLET | ORAL | Status: AC
  Administered 2014-08-15: 600 mg via ORAL
  Filled 2014-08-15: qty 1

## 2014-08-15 MED ORDER — PRENATAL PLUS 27-1 MG PO TABS
ORAL_TABLET | ORAL | Status: AC
  Administered 2014-08-15: 1 via ORAL
  Filled 2014-08-15: qty 1

## 2014-08-15 MED ORDER — IBUPROFEN 600 MG PO TABS
ORAL_TABLET | ORAL | Status: AC
  Administered 2014-08-16: 600 mg via ORAL
  Filled 2014-08-15: qty 1

## 2014-08-15 MED ORDER — SENNOSIDES-DOCUSATE SODIUM 8.6-50 MG PO TABS
2.0000 | ORAL_TABLET | ORAL | Status: DC
  Administered 2014-08-15 – 2014-08-17 (×2): 2 via ORAL
  Filled 2014-08-15 (×2): qty 2

## 2014-08-15 MED ORDER — ONDANSETRON HCL 4 MG/2ML IJ SOLN: 4.0000 mg | INTRAMUSCULAR | Status: DC | PRN

## 2014-08-15 MED ORDER — OXYCODONE-ACETAMINOPHEN 5-325 MG PO TABS
ORAL_TABLET | ORAL | Status: AC
  Administered 2014-08-16: 2 via ORAL
  Filled 2014-08-15: qty 1

## 2014-08-15 MED ORDER — ZOLPIDEM TARTRATE 5 MG PO TABS: 5.0000 mg | ORAL_TABLET | Freq: Every evening | ORAL | Status: DC | PRN

## 2014-08-15 MED ORDER — MEASLES, MUMPS & RUBELLA VAC ~~LOC~~ INJ
0.5000 mL | INJECTION | Freq: Once | SUBCUTANEOUS | Status: AC
  Administered 2014-08-17: 0.5 mL via SUBCUTANEOUS
  Filled 2014-08-15 (×2): qty 0.5

## 2014-08-15 MED ORDER — PRENATAL MULTIVITAMIN CH
1.0000 | ORAL_TABLET | Freq: Every day | ORAL | Status: DC
  Administered 2014-08-15 – 2014-08-17 (×3): 1 via ORAL
  Filled 2014-08-15 (×2): qty 1

## 2014-08-15 MED ORDER — BENZOCAINE-MENTHOL 20-0.5 % EX AERO
1.0000 "application " | INHALATION_SPRAY | CUTANEOUS | Status: DC | PRN
  Administered 2014-08-15: 1 via TOPICAL
  Filled 2014-08-15: qty 56

## 2014-08-15 MED ORDER — OXYCODONE-ACETAMINOPHEN 5-325 MG PO TABS
1.0000 | ORAL_TABLET | ORAL | Status: DC | PRN
  Administered 2014-08-15 – 2014-08-16 (×5): 1 via ORAL
  Filled 2014-08-15 (×3): qty 1

## 2014-08-15 MED ORDER — DIPHENHYDRAMINE HCL 25 MG PO CAPS: 25.0000 mg | ORAL_CAPSULE | Freq: Four times a day (QID) | ORAL | Status: DC | PRN

## 2014-08-15 MED ORDER — WITCH HAZEL-GLYCERIN EX PADS: 1.0000 "application " | MEDICATED_PAD | CUTANEOUS | Status: DC | PRN

## 2014-08-15 MED ORDER — BENZOCAINE-MENTHOL 20-0.5 % EX AERO
INHALATION_SPRAY | CUTANEOUS | Status: AC
  Filled 2014-08-15: qty 56

## 2014-08-15 MED ORDER — TETANUS-DIPHTH-ACELL PERTUSSIS 5-2.5-18.5 LF-MCG/0.5 IM SUSP: 0.5000 mL | Freq: Once | INTRAMUSCULAR | Status: DC

## 2014-08-15 MED ORDER — HYDROCORTISONE ACE-PRAMOXINE 1-1 % RE FOAM
1.0000 | Freq: Two times a day (BID) | RECTAL | Status: DC
  Administered 2014-08-15 – 2014-08-17 (×4): 1 via RECTAL
  Filled 2014-08-15: qty 10

## 2014-08-15 MED ORDER — LANOLIN HYDROUS EX OINT: TOPICAL_OINTMENT | CUTANEOUS | Status: DC | PRN

## 2014-08-15 MED ORDER — DIBUCAINE 1 % RE OINT: 1.0000 "application " | TOPICAL_OINTMENT | RECTAL | Status: DC | PRN

## 2014-08-15 MED ORDER — OXYCODONE-ACETAMINOPHEN 5-325 MG PO TABS
2.0000 | ORAL_TABLET | ORAL | Status: DC | PRN
  Administered 2014-08-15 – 2014-08-16 (×2): 2 via ORAL
  Filled 2014-08-15: qty 2

## 2014-08-15 MED ORDER — HYDROCORTISONE ACE-PRAMOXINE 1-1 % RE FOAM
RECTAL | Status: AC
  Administered 2014-08-15: 1 via RECTAL
  Filled 2014-08-15: qty 10

## 2014-08-15 MED ORDER — SIMETHICONE 80 MG PO CHEW: 80.0000 mg | CHEWABLE_TABLET | ORAL | Status: DC | PRN

## 2014-08-15 NOTE — Plan of Care (Signed)
Problem: Phase I Progression Outcomes Goal: Pain controlled with appropriate interventions Outcome: Completed/Met Date Met:  08/15/14 Goal: Initial discharge plan identified Outcome: Completed/Met Date Met:  08/15/14 Goal: Other Phase I Outcomes/Goals Outcome: Completed/Met Date Met:  08/15/14  Problem: Phase II Progression Outcomes Goal: Pain controlled on oral analgesia Outcome: Completed/Met Date Met:  08/15/14 Goal: Tolerating diet Outcome: Completed/Met Date Met:  08/15/14

## 2014-08-15 NOTE — Progress Notes (Addendum)
Post Partum Day 1 Subjective: no complaints, up ad lib, voiding, tolerating PO and + flatus  Objective: Blood pressure 107/70, pulse 63, temperature 97.6 F (36.4 C), temperature source Oral, resp. rate 18, height 5' (1.524 m), weight 70.308 kg (155 lb), last menstrual period 10/31/2013, SpO2 99 %, unknown if currently breastfeeding.  Physical Exam:  General: alert, cooperative, appears stated age and no distress Lochia: appropriate Uterine Fundus: firm Incision: N/A DVT Evaluation: No evidence of DVT seen on physical exam.   Recent Labs  08/13/14 1000 08/15/14 0556  HGB 12.4 11.0*  HCT 35.9* 32.3*    Assessment/Plan: Plan for discharge tomorrow, Breastfeeding, Lactation consult, Social Work consult and Contraception Pt will f/u with primary OB for mini-pill   LOS: 2 days   Benjamin Stainhompson, McKenzie L 08/15/2014, 8:43 AM   I have seen and examined this patient and I agree with the above. Cam HaiSHAW, KIMBERLY CNM 9:32 AM 08/15/2014

## 2014-08-15 NOTE — Plan of Care (Signed)
Problem: Phase II Progression Outcomes Goal: Progress activity as tolerated unless otherwise ordered Outcome: Completed/Met Date Met:  08/15/14 Goal: Other Phase II Outcomes/Goals Outcome: Not Applicable Date Met:  15/50/27

## 2014-08-15 NOTE — Plan of Care (Signed)
Problem: Phase II Progression Outcomes Goal: Afebrile, VS remain stable Outcome: Completed/Met Date Met:  08/15/14

## 2014-08-15 NOTE — Anesthesia Postprocedure Evaluation (Signed)
Anesthesia Post Note  Patient: Olivia Obrien  Procedure(s) Performed: * No procedures listed *  Anesthesia type: Epidural  Patient location: Mother/Baby  Post pain: Pain level controlled  Post assessment: Post-op Vital signs reviewed  Last Vitals:  Filed Vitals:   08/15/14 0930  BP: 101/61  Pulse: 60  Temp: 36.4 C  Resp: 16    Post vital signs: Reviewed  Level of consciousness:alert  Complications: No apparent anesthesia complications

## 2014-08-15 NOTE — Progress Notes (Signed)
Patient seen by CSW.  Note to follow. 

## 2014-08-15 NOTE — Plan of Care (Signed)
Problem: Consults Goal: Skin Care Protocol Initiated - if Braden Score 18 or less If consults are not indicated, leave blank or document N/A Outcome: Not Applicable Date Met:  69/62/95  Problem: Phase I Progression Outcomes Goal: Voiding adequately Outcome: Completed/Met Date Met:  08/15/14 Goal: OOB as tolerated unless otherwise ordered Outcome: Completed/Met Date Met:  08/15/14 Goal: VS, stable, temp < 100.4 degrees F Outcome: Completed/Met Date Met:  08/15/14

## 2014-08-16 NOTE — Progress Notes (Signed)
Post Partum Day 2 Subjective: no complaints, up ad lib, voiding, tolerating PO, + flatus and Complains of some burning with urination, and concern over lochia, which I checked and appear to be normal.    Objective: Blood pressure 100/64, pulse 74, temperature 97.8 F (36.6 C), temperature source Oral, resp. rate 18, height 5' (1.524 m), weight 70.308 kg (155 lb), last menstrual period 10/31/2013, SpO2 99 %, unknown if currently breastfeeding.  Physical Exam:  General: alert, cooperative, appears stated age and no distress Lochia: appropriate Uterine Fundus: firm Incision: N/a DVT Evaluation: No evidence of DVT seen on physical exam.   Recent Labs  08/13/14 1000 08/15/14 0556  HGB 12.4 11.0*  HCT 35.9* 32.3*    Assessment/Plan: Plan for discharge tomorrow in setting of fever yesterday, or at 48 hours at 9:45 tonight.  Pt doing well.     LOS: 3 days   Benjamin Stainhompson, Hermila Millis L 08/16/2014, 7:08 AM

## 2014-08-16 NOTE — Discharge Summary (Signed)
Obstetric Discharge Summary Reason for Admission: onset of labor Prenatal Procedures: none Intrapartum Procedures: spontaneous vaginal delivery Postpartum Procedures: none Complications-Operative and Postpartum: none HEMOGLOBIN  Date Value Ref Range Status  08/15/2014 11.0* 12.0 - 15.0 g/dL Final   HCT  Date Value Ref Range Status  08/15/2014 32.3* 36.0 - 46.0 % Final    Physical Exam:  General: alert, cooperative, appears stated age and no distress Lochia: appropriate Uterine Fundus: firm Incision: N/a DVT Evaluation: No evidence of DVT seen on physical exam.  Discharge Diagnoses: Term Pregnancy-delivered  Discharge Information: Date: 08/16/2014 Activity: pelvic rest Diet: routine Medications: Ibuprofen, Colace and Iron Condition: stable Instructions: refer to practice specific booklet Discharge to: home   Newborn Data: Live born female  Birth Weight: 8 lb 13.1 oz (4000 g) APGAR: 7, 8  Home with mother.  Maurie Boettcherhompson, McKenzie L 08/16/2014, 8:34 AM   I spoke with and examined patient and agree with resident/PA/SNM's note and plan of care.  Eating, drinking, voiding, ambulating well.  +flatus.  Lochia and pain wnl.  Denies dizziness, lightheadedness, or sob. No complaints. Breast. POPs for contraception. Cheral MarkerKimberly R. Chelan Heringer, CNM, [facility] 08/16/2014 8:53 AM

## 2014-08-16 NOTE — Plan of Care (Signed)
Problem: Discharge Progression Outcomes Goal: Tolerating diet Outcome: Completed/Met Date Met:  08/16/14     

## 2014-08-16 NOTE — Lactation Note (Signed)
This note was copied from the chart of Olivia Obrien. Lactation Consultation Note  P1, Baby sleeping.  Discussed side lying position.  Mother is having back pain. Reviewed hand express. Good drops of colostrum expressed. Baby has been cluster feeding and mother's nipples are tender, no cracks or bleeding. Provided mother w/ comfort gels. Reviewed Baby & Me pp 20-24. Mother going back to work in 4 weeks.  Has WIC.  Discussed getting a pump from them. Gave mother hand pump. Mom encouraged to feed baby 8-12 times/24 hours and with feeding cues.  Mom made aware of O/P services, breastfeeding support groups, community resources, and our phone # for post-discharge questions.     Patient Name: Olivia CyprusGeorgia Ekman Today's Date: 08/16/2014 Reason for consult: Initial assessment   Maternal Data Has patient been taught Hand Expression?: Yes Does the patient have breastfeeding experience prior to this delivery?: No  Feeding    LATCH Score/Interventions                      Lactation Tools Discussed/Used     Consult Status Consult Status: Follow-up Date: 08/17/14 Follow-up type: In-patient    Dahlia ByesBerkelhammer, Ruth [facility] 08/16/2014, 9:27 AM

## 2014-08-17 ENCOUNTER — Inpatient Hospital Stay (HOSPITAL_COMMUNITY): Admission: RE | Admit: 2014-08-17 | Payer: Medicaid Other | Source: Ambulatory Visit

## 2014-08-17 MED ORDER — IBUPROFEN 600 MG PO TABS: 600.0000 mg | ORAL_TABLET | Freq: Four times a day (QID) | ORAL | Status: DC

## 2014-08-17 MED ORDER — DOCUSATE SODIUM 100 MG PO CAPS: 100.0000 mg | ORAL_CAPSULE | Freq: Two times a day (BID) | ORAL | Status: DC | PRN

## 2014-08-17 NOTE — Plan of Care (Signed)
Problem: Discharge Progression Outcomes Goal: Activity appropriate for discharge plan Outcome: Completed/Met Date Met:  08/17/14 Goal: Afebrile, VS remain stable at discharge Outcome: Completed/Met Date Met:  08/17/14 Goal: Discharge plan in place and appropriate Outcome: Completed/Met Date Met:  08/17/14 Goal: Other Discharge Outcomes/Goals Outcome: Completed/Met Date Met:  08/17/14

## 2014-08-17 NOTE — Discharge Instructions (Signed)

## 2014-08-17 NOTE — Progress Notes (Signed)
Ur chart review completed.  

## 2014-08-17 NOTE — Lactation Note (Addendum)
This note was copied from the chart of Olivia Obrien. Lactation Consultation Note     Follow up consult with this mom of a term baby, now 7462 hours old  And at 5% weight loss. The mom has red, sore nipples, and her breasts are full, her milk has transitioned in. The baby was sleeping after latch. With crying, I noted the baby has a mid posterior frenulum, short with blanching on elevation, and a severe bowl shaped tongue. The baby also has an upper lip frenulum that extends to the gum line, and the lip blanches with flange. I fitted mom with a 24 flange, and the baby latched easily, with a deep latch, strong suckles and audible swallows. Mom's breast was much softer and smaller after a 20 minute feeding.  [place] [facility] [facility] to see if they were able to loan this mom a DEP, or if I should loan a DEP for them from here. Mom and dad are fine with waiting to hear back form WIC before leaving.  Mom applied the nipple shield independently, and was instructed in the care of the shield. i will speak to mom about making an o/p consult prior to their discharge today.  Mom has an appointment for tomorrow, 12/8 at 1330 with WIC to get a DEP and an o/p lactation consult on 12/15 at 1300  Patient Name: Olivia Obrien Today's Date: 08/17/2014 Reason for consult: Follow-up assessment   Maternal Data    Feeding Feeding Type: Breast Fed Length of feed: 20 min  LATCH Score/Interventions Latch: Grasps breast easily, tongue down, lips flanged, rhythmical sucking. (with 24 nipple shield) Intervention(s): Assist with latch  Audible Swallowing: Spontaneous and intermittent  Type of Nipple: Everted at rest and after stimulation  Comfort (Breast/Nipple): Filling, red/small blisters or bruises, mild/mod discomfort  Problem noted: Mild/Moderate discomfort Interventions (Mild/moderate discomfort): Comfort gels  Hold (Positioning): Assistance needed to correctly position infant at  breast and maintain latch. Intervention(s): Breastfeeding basics reviewed;Support Pillows;Position options;Skin to skin  LATCH Score: 8  Lactation Tools Discussed/Used WIC Program: Yes (infor faxd to [place] for DEP) Pump Review: Setup, frequency, and cleaning;Milk Storage Initiated by:: clee rn lc on day of baby's discharge - mom's milk is in, but baby was not able to transfer well until 24 nipple shiled used Date initiated:: 08/17/14   Consult Status Consult Status: Follow-up Follow-up type: Out-patient    Olivia Obrien, Tamura Lasky Anne 08/17/2014, 12:31 PM

## 2014-08-17 NOTE — Discharge Summary (Signed)
Obstetric Discharge Summary Reason for Admission: induction of labor Prenatal Procedures: NST and ultrasound Intrapartum Procedures: spontaneous vaginal delivery Postpartum Procedures: none Complications-Operative and Postpartum: none HEMOGLOBIN  Date Value Ref Range Status  08/15/2014 11.0* 12.0 - 15.0 g/dL Final   HCT  Date Value Ref Range Status  08/15/2014 32.3* 36.0 - 46.0 % Final    Physical Exam:  General: alert, cooperative and no distress Lochia: appropriate Uterine Fundus: firm Incision: n/a DVT Evaluation: No evidence of DVT seen on physical exam. Negative Homan's sign. No cords or calf tenderness. No significant calf/ankle edema.  Discharge Diagnoses: Term Pregnancy-delivered 23 y.o. G1P1001 @[redacted]w[redacted]d  admitted for IOL at 7040w6d for fetal dilated bowel.   On 08/14/14: Delivery Note At 9:44 PM a viable and healthy female was delivered via Vaginal, Spontaneous Delivery (Presentation: Right Occiput Anterior). APGAR: 7, 8; weight 8 lb 13.1 oz (4000 g).  Placenta status: Intact, Spontaneous. Cord: 3 vessels with the following complications:   Anesthesia: Epidural  Episiotomy: None Lacerations: None Suture Repair: None needed Est. Blood Loss (mL): 200   Discharge Information: Date: 08/17/2014 Activity: pelvic rest Diet: routine Medications: Ibuprofen and Colace Condition: stable Instructions: refer to practice specific booklet Discharge to: home Follow-up Information    [facility] [place]   Why:  In 4-6 weeks for postpartum visit   Contact information:   [address] [place] [id number] [phone]      Newborn Data: Live born female  Birth Weight: 8 lb 13.1 oz (4000 g) APGAR: 7, 8  Home with mother.  LEFTWICH-KIRBY, LISA 08/17/2014, 6:24 AM

## 2014-08-17 NOTE — Plan of Care (Signed)
Problem: Consults Goal: Postpartum Patient Education (See Patient Education module for education specifics.)  Outcome: Progressing  Problem: Discharge Progression Outcomes Goal: Barriers To Progression Addressed/Resolved Outcome: Not Applicable Date Met:  46/21/94 Goal: Complications resolved/controlled Outcome: Not Applicable Date Met:  71/25/27 Goal: Pain controlled with appropriate interventions Outcome: Completed/Met Date Met:  08/17/14 Goal: MMR given as ordered Outcome: Not Met (add Reason) Rubella (non immune)

## 2014-08-20 ENCOUNTER — Encounter (HOSPITAL_COMMUNITY): Payer: Self-pay

## 2014-08-20 ENCOUNTER — Inpatient Hospital Stay (HOSPITAL_COMMUNITY)
Admission: AD | Admit: 2014-08-20 | Discharge: 2014-08-20 | Disposition: A | Discharge status: Home / Self Care | Payer: Medicaid Other | Source: Ambulatory Visit | Attending: Obstetrics & Gynecology | Admitting: Obstetrics & Gynecology

## 2014-08-20 DIAGNOSIS — O9989 Other specified diseases and conditions complicating pregnancy, childbirth and the puerperium: Secondary | ICD-10-CM | POA: Diagnosis not present

## 2014-08-20 DIAGNOSIS — O165 Unspecified maternal hypertension, complicating the puerperium: Secondary | ICD-10-CM

## 2014-08-20 DIAGNOSIS — L293 Anogenital pruritus, unspecified: Secondary | ICD-10-CM | POA: Insufficient documentation

## 2014-08-20 DIAGNOSIS — O169 Unspecified maternal hypertension, unspecified trimester: Secondary | ICD-10-CM

## 2014-08-20 DIAGNOSIS — R21 Rash and other nonspecific skin eruption: Secondary | ICD-10-CM | POA: Insufficient documentation

## 2014-08-20 LAB — URINALYSIS, ROUTINE W REFLEX MICROSCOPIC
Bilirubin Urine: NEGATIVE
GLUCOSE, UA: NEGATIVE mg/dL
KETONES UR: 15 mg/dL — AB
NITRITE: NEGATIVE
PROTEIN: NEGATIVE mg/dL
Specific Gravity, Urine: 1.02 (ref 1.005–1.030)
UROBILINOGEN UA: 0.2 mg/dL (ref 0.0–1.0)
pH: 6 (ref 5.0–8.0)

## 2014-08-20 LAB — URINE MICROSCOPIC-ADD ON

## 2014-08-20 LAB — CBC WITH DIFFERENTIAL/PLATELET
BASOS ABS: 0 10*3/uL (ref 0.0–0.1)
BASOS PCT: 0 % (ref 0–1)
EOS ABS: 0.2 10*3/uL (ref 0.0–0.7)
Eosinophils Relative: 2 % (ref 0–5)
HEMATOCRIT: 32.2 % — AB (ref 36.0–46.0)
Hemoglobin: 10.7 g/dL — ABNORMAL LOW (ref 12.0–15.0)
Lymphocytes Relative: 25 % (ref 12–46)
Lymphs Abs: 1.9 10*3/uL (ref 0.7–4.0)
MCH: 30.3 pg (ref 26.0–34.0)
MCHC: 33.2 g/dL (ref 30.0–36.0)
MCV: 91.2 fL (ref 78.0–100.0)
Monocytes Absolute: 0.7 10*3/uL (ref 0.1–1.0)
Monocytes Relative: 9 % (ref 3–12)
Neutro Abs: 4.9 10*3/uL (ref 1.7–7.7)
Neutrophils Relative %: 64 % (ref 43–77)
PLATELETS: 201 10*3/uL (ref 150–400)
RBC: 3.53 MIL/uL — ABNORMAL LOW (ref 3.87–5.11)
RDW: 13.6 % (ref 11.5–15.5)
WBC: 7.7 10*3/uL (ref 4.0–10.5)

## 2014-08-20 LAB — COMPREHENSIVE METABOLIC PANEL
ALT: 47 U/L — AB (ref 0–35)
AST: 30 U/L (ref 0–37)
Albumin: 2.8 g/dL — ABNORMAL LOW (ref 3.5–5.2)
Alkaline Phosphatase: 135 U/L — ABNORMAL HIGH (ref 39–117)
Anion gap: 13 (ref 5–15)
BUN: 11 mg/dL (ref 6–23)
CALCIUM: 8.7 mg/dL (ref 8.4–10.5)
CO2: 21 meq/L (ref 19–32)
Chloride: 106 mEq/L (ref 96–112)
Creatinine, Ser: 0.59 mg/dL (ref 0.50–1.10)
GFR calc Af Amer: >90 mL/min (ref >90)
GFR calc non Af Amer: >90 mL/min (ref >90)
Glucose, Bld: 80 mg/dL (ref 70–99)
Potassium: 3.7 mEq/L (ref 3.7–5.3)
SODIUM: 140 meq/L (ref 137–147)
TOTAL PROTEIN: 6.3 g/dL (ref 6.0–8.3)
Total Bilirubin: 0.4 mg/dL (ref 0.3–1.2)

## 2014-08-20 LAB — PROTEIN / CREATININE RATIO, URINE
CREATININE, URINE: 104.11 mg/dL
Protein Creatinine Ratio: 0.14 (ref 0.00–0.15)
Total Protein, Urine: 14.7 mg/dL

## 2014-08-20 MED ORDER — AMLODIPINE BESYLATE 2.5 MG PO TABS: 5.0000 mg | ORAL_TABLET | Freq: Every day | ORAL | Status: DC

## 2014-08-20 MED ORDER — HYDROCHLOROTHIAZIDE 25 MG PO TABS: 25.0000 mg | ORAL_TABLET | Freq: Every day | ORAL | Status: DC

## 2014-08-20 MED ORDER — CLOBETASOL PROPIONATE 0.05 % EX OINT: 1.0000 "application " | TOPICAL_OINTMENT | Freq: Two times a day (BID) | CUTANEOUS | Status: DC

## 2014-08-20 MED ORDER — LORATADINE 10 MG PO TABS
10.0000 mg | ORAL_TABLET | Freq: Every day | ORAL | Status: DC
Start: 2014-08-20 — End: 2014-08-20
  Administered 2014-08-20: 10 mg via ORAL
  Filled 2014-08-20 (×2): qty 1

## 2014-08-20 MED ORDER — AMLODIPINE BESYLATE 5 MG PO TABS
5.0000 mg | ORAL_TABLET | Freq: Once | ORAL | Status: AC
  Administered 2014-08-20: 5 mg via ORAL
  Filled 2014-08-20: qty 1

## 2014-08-20 MED ORDER — HYDROCHLOROTHIAZIDE 25 MG PO TABS
25.0000 mg | ORAL_TABLET | Freq: Every day | ORAL | Status: DC
  Administered 2014-08-20: 25 mg via ORAL
  Filled 2014-08-20 (×2): qty 1

## 2014-08-20 MED ORDER — LORATADINE 10 MG PO TABS: 10.0000 mg | ORAL_TABLET | Freq: Every day | ORAL | Status: DC

## 2014-08-20 NOTE — MAU Note (Signed)
Vaginal itching/burning/rash x 2 days. States has normal lochia, hasn't noticed any additional discharge. Had SVD on 12/4, no lacerations.

## 2014-08-20 NOTE — MAU Provider Note (Signed)
History     CSN: [id number]  Arrival date and time: 08/20/14 7829   None     Chief Complaint  Patient presents with  . Rash  . Vaginal Itching   HPI Ms. Olivia Obrien is a 23 y.o. G1P1001 PPD # 6 after NSVD who presents to MAU today with complaint of vaginal rash. She states rash x 2 days. She endorses associated itching and pain. She is using dermaplast and hydrocortisone cream on the area. She denies any fever, headache, blurred vision, RUQ pain or peripheral edema. Patient is breastfeeding.   OB History    Gravida Para Term Preterm AB TAB SAB Ectopic Multiple Living   1 1 1       0 1      Past Medical History  Diagnosis Date  . Hx of chlamydia infection   . Medical history non-contributory     Past Surgical History  Procedure Laterality Date  . Wisdom tooth extraction      Family History  Problem Relation Age of Onset  . Depression Mother   . Hypertension Mother   . Heart disease Father     clot in heart  . Seizures Father   . Tuberculosis Father   . Depression Father     History  Substance Use Topics  . Smoking status: Never Smoker   . Smokeless tobacco: Not on file  . Alcohol Use: No    Allergies:  Allergies  Allergen Reactions  . Bee Venom Swelling    At the site of sting    Prescriptions prior to admission  Medication Sig Dispense Refill Last Dose  . acetaminophen (TYLENOL) 325 MG tablet Take 650 mg by mouth every 6 (six) hours as needed.   08/19/2014 at Unknown time  . docusate sodium (COLACE) 100 MG capsule Take 1 capsule (100 mg total) by mouth 2 (two) times daily as needed. 30 capsule 2 Past Week at Unknown time  . hydrocortisone-pramoxine (PROCTOFOAM-HC) rectal foam Place 1 applicator rectally 2 (two) times daily.   08/19/2014 at Unknown time  . ibuprofen (ADVIL,MOTRIN) 600 MG tablet Take 1 tablet (600 mg total) by mouth every 6 (six) hours. 30 tablet 0 08/19/2014 at Unknown time  . Prenatal Vit-Fe Fumarate-FA (PRENATAL MULTIVITAMIN) TABS  tablet Take 1 tablet by mouth daily at 12 noon.   08/19/2014 at Unknown time    Review of Systems  Constitutional: Negative for fever and malaise/fatigue.  Eyes: Negative for blurred vision.  Gastrointestinal: Negative for abdominal pain.  Genitourinary:       + vaginal bleeding  Neurological: Negative for headaches.   Physical Exam   Blood pressure 152/93, pulse 53, temperature 98.7 F (37.1 C), temperature source Oral, resp. rate 16, height 5' (1.524 m), weight 142 lb 9.6 oz (64.683 kg), last menstrual period 10/31/2013, unknown if currently breastfeeding.  Physical Exam  Constitutional: She is oriented to person, place, and time. She appears well-developed and well-nourished. No distress.  HENT:  Head: Normocephalic.  Cardiovascular: Normal rate.   Respiratory: Effort normal.  GI: Soft. She exhibits no distension. There is no tenderness.  Musculoskeletal: She exhibits no edema.  Neurological: She is alert and oriented to person, place, and time. She has normal reflexes.  No clonus  Skin: Skin is warm and dry. No erythema.  Psychiatric: She has a normal mood and affect.   Results for orders placed or performed during the hospital encounter of 08/20/14 (from the past 24 hour(s))  Urinalysis, Routine w reflex microscopic  Status: Abnormal   Collection Time: 08/20/14  6:38 AM  Result Value Ref Range   Color, Urine YELLOW YELLOW   APPearance CLEAR CLEAR   Specific Gravity, Urine 1.020 1.005 - 1.030   pH 6.0 5.0 - 8.0   Glucose, UA NEGATIVE NEGATIVE mg/dL   Hgb urine dipstick LARGE (A) NEGATIVE   Bilirubin Urine NEGATIVE NEGATIVE   Ketones, ur 15 (A) NEGATIVE mg/dL   Protein, ur NEGATIVE NEGATIVE mg/dL   Urobilinogen, UA 0.2 0.0 - 1.0 mg/dL   Nitrite NEGATIVE NEGATIVE   Leukocytes, UA SMALL (A) NEGATIVE  Protein / creatinine ratio, urine     Status: None   Collection Time: 08/20/14  6:38 AM  Result Value Ref Range   Creatinine, Urine 104.11 mg/dL   Total Protein, Urine  14.7 mg/dL   Protein Creatinine Ratio 0.14 0.00 - 0.15  Urine microscopic-add on     Status: None   Collection Time: 08/20/14  6:38 AM  Result Value Ref Range   Squamous Epithelial / LPF RARE RARE   WBC, UA 11-20 <3 WBC/hpf   RBC / HPF 3-6 <3 RBC/hpf   Bacteria, UA RARE RARE  CBC with Differential     Status: Abnormal   Collection Time: 08/20/14  6:55 AM  Result Value Ref Range   WBC 7.7 4.0 - 10.5 K/uL   RBC 3.53 (L) 3.87 - 5.11 MIL/uL   Hemoglobin 10.7 (L) 12.0 - 15.0 g/dL   HCT 16.132.2 (L) 09.636.0 - 04.546.0 %   MCV 91.2 78.0 - 100.0 fL   MCH 30.3 26.0 - 34.0 pg   MCHC 33.2 30.0 - 36.0 g/dL   RDW 40.913.6 81.111.5 - 91.415.5 %   Platelets 201 150 - 400 K/uL   Neutrophils Relative % 64 43 - 77 %   Neutro Abs 4.9 1.7 - 7.7 K/uL   Lymphocytes Relative 25 12 - 46 %   Lymphs Abs 1.9 0.7 - 4.0 K/uL   Monocytes Relative 9 3 - 12 %   Monocytes Absolute 0.7 0.1 - 1.0 K/uL   Eosinophils Relative 2 0 - 5 %   Eosinophils Absolute 0.2 0.0 - 0.7 K/uL   Basophils Relative 0 0 - 1 %   Basophils Absolute 0.0 0.0 - 0.1 K/uL  Comprehensive metabolic panel     Status: Abnormal   Collection Time: 08/20/14  6:55 AM  Result Value Ref Range   Sodium 140 137 - 147 mEq/L   Potassium 3.7 3.7 - 5.3 mEq/L   Chloride 106 96 - 112 mEq/L   CO2 21 19 - 32 mEq/L   Glucose, Bld 80 70 - 99 mg/dL   BUN 11 6 - 23 mg/dL   Creatinine, Ser 7.820.59 0.50 - 1.10 mg/dL   Calcium 8.7 8.4 - 95.610.5 mg/dL   Total Protein 6.3 6.0 - 8.3 g/dL   Albumin 2.8 (L) 3.5 - 5.2 g/dL   AST 30 0 - 37 U/L   ALT 47 (H) 0 - 35 U/L   Alkaline Phosphatase 135 (H) 39 - 117 U/L   Total Bilirubin 0.4 0.3 - 1.2 mg/dL   GFR calc non Af Amer >90 >90 mL/min   GFR calc Af Amer >90 >90 mL/min   Anion gap 13 5 - 15     Patient Vitals for the past 24 hrs:  BP Temp Temp src Pulse Resp Height Weight  08/20/14 0652 152/93 mmHg - - (!) 53 - - -  08/20/14 [id number] (!) 166/108 mmHg - - 62 - - -  08/20/14 0629 (!) 152/103 mmHg 98.7 F (37.1 C) Oral (!) 57 16 - -   08/20/14 16100619 - - - - - 5' (1.524 m) 142 lb 9.6 oz (64.683 kg)    MAU Course  Procedures None  MDM CBC, CMP, Uric acid, LDH and Urine protein/creatinine ratio Serial BPs Discussed patient presentation, vital signs and lab results with Dr. Macon LargeAnyanwu. Recommends observation x 2 additional hours and continued BPs.  Will decide at that time whether patient will need admission for MgSO4 or outpatient PP HTN management 0800 - Observation in MAU. Continued serial BPs. Care turned over to Wynelle BourgeoisMarie Kaylinn Dedic, CNM  Marny LowensteinJulie N Wenzel, PA-C  08/20/2014, 7:53 AM  Assessment and Plan   Filed Vitals:   08/20/14 1302 08/20/14 1323 08/20/14 1332 08/20/14 1419  BP: 158/88 150/103 142/104 142/104  Pulse: 65 65 77 77  Temp:    98.6 F (37 C)  TempSrc:    Oral  Resp:    20  Height:      Weight:       Discussed with Dr Debroah LoopArnold OK to discharge home   Medication List    TAKE these medications        acetaminophen 325 MG tablet  Commonly known as:  TYLENOL  Take 650 mg by mouth every 6 (six) hours as needed.     amLODipine 2.5 MG tablet  Commonly known as:  NORVASC  Take 2 tablets (5 mg total) by mouth daily.     clobetasol ointment 0.05 %  Commonly known as:  TEMOVATE  Apply 1 application topically 2 (two) times daily. Apply to affected area     docusate sodium 100 MG capsule  Commonly known as:  COLACE  Take 1 capsule (100 mg total) by mouth 2 (two) times daily as needed.     hydrochlorothiazide 25 MG tablet  Commonly known as:  HYDRODIURIL  Take 1 tablet (25 mg total) by mouth daily.     hydrocortisone-pramoxine rectal foam  Commonly known as:  PROCTOFOAM-HC  Place 1 applicator rectally 2 (two) times daily.     ibuprofen 600 MG tablet  Commonly known as:  ADVIL,MOTRIN  Take 1 tablet (600 mg total) by mouth every 6 (six) hours.     loratadine 10 MG tablet  Commonly known as:  CLARITIN  Take 1 tablet (10 mg total) by mouth daily.     prenatal multivitamin Tabs tablet  Take 1  tablet by mouth daily at 12 noon.       WIll have BP checked at home and see pt next week in clinic. Aviva SignsMarie L Shayma Pfefferle, CNM

## 2014-08-20 NOTE — Discharge Instructions (Signed)

## 2014-08-21 ENCOUNTER — Encounter: Payer: Self-pay | Admitting: *Deleted

## 2014-08-21 NOTE — Progress Notes (Signed)
[facility]  States patient was seen in MAU yesterday for elevated BP and started on medication.  Wants to report BP today as 122/84 in the left arm.  BP medication started yesterday.  States patient has appointment with our office on Monday for BP check.

## 2014-08-24 ENCOUNTER — Ambulatory Visit: Payer: Medicaid Other | Admitting: *Deleted

## 2014-08-24 VITALS — BP 122/74 | HR 101 | Wt 130.2 lb

## 2014-08-24 DIAGNOSIS — Z0130 Encounter for examination of blood pressure without abnormal findings: Secondary | ICD-10-CM

## 2014-08-24 NOTE — Progress Notes (Signed)
Here for blood pressure check after having elevated blood pressure in MAU 08/20/14. States she [facility] 09/22/14 12:45. Reported blood pressure today 122/74 to Dr. Burnice LoganHarrawayKatrinka Blazing- Smith, and that patient is taking her 2 bp meds and has a postpartum appointment scheduled. No other orders. Instructed patient to keep taking bp meds , keep her already scheduled postpartum appointment and if she has any severe symptoms, headache not relieved by tylenol, excessive edema, visual changes to go to MAU. Olivia Obrien voices understanding.

## 2015-02-14 ENCOUNTER — Encounter: Payer: Self-pay | Admitting: Emergency Medicine

## 2015-02-14 ENCOUNTER — Emergency Department
Admission: EM | Admit: 2015-02-14 | Discharge: 2015-02-14 | Disposition: A | Discharge status: Home / Self Care | Payer: Medicaid Other | Attending: Emergency Medicine | Admitting: Emergency Medicine

## 2015-02-14 ENCOUNTER — Emergency Department: Payer: Medicaid Other

## 2015-02-14 DIAGNOSIS — R202 Paresthesia of skin: Secondary | ICD-10-CM | POA: Insufficient documentation

## 2015-02-14 DIAGNOSIS — Z79899 Other long term (current) drug therapy: Secondary | ICD-10-CM | POA: Insufficient documentation

## 2015-02-14 DIAGNOSIS — L539 Erythematous condition, unspecified: Secondary | ICD-10-CM | POA: Insufficient documentation

## 2015-02-14 LAB — URINALYSIS COMPLETE WITH MICROSCOPIC (ARMC ONLY)
BILIRUBIN URINE: NEGATIVE
Glucose, UA: NEGATIVE mg/dL
Hgb urine dipstick: NEGATIVE
Nitrite: NEGATIVE
Protein, ur: NEGATIVE mg/dL
Specific Gravity, Urine: 1.024 (ref 1.005–1.030)
pH: 5 (ref 5.0–8.0)

## 2015-02-14 NOTE — Discharge Instructions (Signed)

## 2015-02-14 NOTE — ED Notes (Signed)
Pt c/o numbness to third right hand finger this am when she woke up. No known injury..Marland Kitchen

## 2015-02-14 NOTE — ED Provider Notes (Signed)
[facility] Emergency Department Provider Note  ____________________________________________  Time seen: Approximately 4:33 PM  I have reviewed the triage vital signs and the nursing notes.   HISTORY  Chief Complaint Numbness    HPI Olivia Obrien is a 24 y.o. female presents for evaluation of the right hand third finger with complaints of erythema ankle numbness to touch. Patient states that she noticed this morning that some redness was developing and that her finger actually felt tingly or numb at different times. Denies any trauma. Past medical history significant for 6 weeks postpartum and hypertensive. Currently not taking any medication for blood pressure.   Past Medical History  Diagnosis Date  . Hx of chlamydia infection   . Medical history non-contributory     Patient Active Problem List   Diagnosis Date Noted  . NSVD (normal spontaneous vaginal delivery) 08/14/2014  . Post term pregnancy 08/13/2014  . Post-term pregnancy, 40-42 weeks of gestation   . Small bowel obstruction, fetal, affecting care of mother, antepartum     Past Surgical History  Procedure Laterality Date  . Wisdom tooth extraction      Current Outpatient Rx  Name  Route  Sig  Dispense  Refill  . acetaminophen (TYLENOL) 325 MG tablet   Oral   Take 650 mg by mouth every 6 (six) hours as needed.         Marland Kitchen amLODipine (NORVASC) 2.5 MG tablet   Oral   Take 2 tablets (5 mg total) by mouth daily.   40 tablet   0      tablets are fine, Could not get Epic to pull u ...   . clobetasol ointment (TEMOVATE) 0.05 %   Topical   Apply 1 application topically 2 (two) times daily. Apply to affected area   30 g   2   . docusate sodium (COLACE) 100 MG capsule   Oral   Take 1 capsule (100 mg total) by mouth 2 (two) times daily as needed. Patient not taking: Reported on 08/24/2014   30 capsule   2   . hydrochlorothiazide (HYDRODIURIL) 25 MG tablet   Oral   Take 1  tablet (25 mg total) by mouth daily.   20 tablet   0   . hydrocortisone-pramoxine (PROCTOFOAM-HC) rectal foam   Rectal   Place 1 applicator rectally 2 (two) times daily.         Marland Kitchen ibuprofen (ADVIL,MOTRIN) 600 MG tablet   Oral   Take 1 tablet (600 mg total) by mouth every 6 (six) hours.   30 tablet   0   . loratadine (CLARITIN) 10 MG tablet   Oral   Take 1 tablet (10 mg total) by mouth daily.   30 tablet   0   . Prenatal Vit-Fe Fumarate-FA (PRENATAL MULTIVITAMIN) TABS tablet   Oral   Take 1 tablet by mouth daily at 12 noon.           Allergies Bee venom and Dermoplast  Family History  Problem Relation Age of Onset  . Depression Mother   . Hypertension Mother   . Heart disease Father     clot in heart  . Seizures Father   . Tuberculosis Father   . Depression Father     Social History History  Substance Use Topics  . Smoking status: Never Smoker   . Smokeless tobacco: Not on file  . Alcohol Use: No    Review of Systems Constitutional: No fever/chills Eyes: No visual  changes. ENT: No sore throat. Cardiovascular: Denies chest pain. Respiratory: Denies shortness of breath. Gastrointestinal: No abdominal pain.  No nausea, no vomiting.  No diarrhea.  No constipation. Genitourinary: Negative for dysuria. Musculoskeletal: Negative for back pain. Skin: Negative for rash. Neurological: Negative for headaches, focal weakness or numbness.  10-point ROS otherwise negative.  ____________________________________________   PHYSICAL EXAM:  VITAL SIGNS: ED Triage Vitals  Enc Vitals Group     BP 02/14/15 1603 138/97 mmHg     Pulse Rate 02/14/15 1603 80     Resp 02/14/15 1603 20     Temp 02/14/15 1603 98.1 F (36.7 C)     Temp Source 02/14/15 1603 Oral     SpO2 02/14/15 1603 99 %     Weight 02/14/15 1604 127 lb (57.607 kg)     Height 02/14/15 1603 5' (1.524 m)     Head Cir --      Peak Flow --      Pain Score 02/14/15 1604 0     Pain Loc --      Pain  Edu? --      Excl. in GC? --     Constitutional: Alert and oriented. Well appearing and in no acute distress. Eyes: Conjunctivae are normal. PERRL. EOMI. Head: Atraumatic. Nose: No congestion/rhinnorhea. Mouth/Throat: Mucous membranes are moist.  Oropharynx non-erythematous. Neck: No stridor.   Cardiovascular: Normal rate, regular rhythm. Grossly normal heart sounds.  Good peripheral circulation. Respiratory: Normal respiratory effort.  No retractions. Lungs CTAB. Gastrointestinal: Soft and nontender. No distention. No abdominal bruits. No CVA tenderness. Musculoskeletal: No lower extremity tenderness nor edema.  No joint effusions. Neurologic:  Normal speech and language. No gross focal neurologic deficits are appreciated. Speech is normal. No gait instability. Skin:  Skin is warm, dry and intact. Erythema noted on the lateral aspect of the right third finger. Psychiatric: Mood and affect are normal. Speech and behavior are normal.  ____________________________________________   LABS (all labs ordered are listed, but only abnormal results are displayed)  Labs Reviewed  URINALYSIS COMPLETEWITH MICROSCOPIC ([facility] ONLY) - Abnormal; Notable for the following:    Color, Urine YELLOW (*)    APPearance CLEAR (*)    Ketones, ur TRACE (*)    Leukocytes, UA TRACE (*)    Bacteria, UA RARE (*)    Squamous Epithelial / LPF 6-30 (*)    All other components within normal limits   ____________________________________________  RADIOLOGY  Negative ____________________________________________   PROCEDURES  Procedure(s) performed: None  Critical Care performed: No  ____________________________________________   INITIAL IMPRESSION / ASSESSMENT AND PLAN / ED COURSE  Pertinent labs & imaging results that were available during my care of the patient were reviewed by me and considered in my medical decision making (see chart for details). Discussed all clinical findings with the patient.  Discussed elevated blood pressure with patient. Reassurance provided with the paresthesias the finger. Currently not symptomatic at this time. Currently patient voices no other emergency medical complaints at this visit. Encouraged patient to establish local PCP for blood pressure control. ____________________________________________Will follow-up if symptoms continue or worsen.    FINAL CLINICAL IMPRESSION(S) / ED DIAGNOSES  Final diagnoses:  Right hand paresthesia      Evangeline DakinCharles M Kaceton Vieau, PA-C 02/14/15 1838  Myrna Blazeravid Matthew Schaevitz, MD 02/14/15 [id number]

## 2015-02-15 ENCOUNTER — Encounter: Payer: Self-pay | Admitting: General Practice

## 2015-02-15 ENCOUNTER — Emergency Department: Payer: Medicaid Other

## 2015-02-15 ENCOUNTER — Emergency Department
Admission: EM | Admit: 2015-02-15 | Discharge: 2015-02-15 | Disposition: A | Discharge status: Home / Self Care | Payer: Medicaid Other | Attending: Emergency Medicine | Admitting: Emergency Medicine

## 2015-02-15 DIAGNOSIS — Y939 Activity, unspecified: Secondary | ICD-10-CM | POA: Insufficient documentation

## 2015-02-15 DIAGNOSIS — Y999 Unspecified external cause status: Secondary | ICD-10-CM | POA: Insufficient documentation

## 2015-02-15 DIAGNOSIS — Y929 Unspecified place or not applicable: Secondary | ICD-10-CM | POA: Insufficient documentation

## 2015-02-15 DIAGNOSIS — S90122A Contusion of left lesser toe(s) without damage to nail, initial encounter: Secondary | ICD-10-CM | POA: Insufficient documentation

## 2015-02-15 DIAGNOSIS — X58XXXA Exposure to other specified factors, initial encounter: Secondary | ICD-10-CM | POA: Insufficient documentation

## 2015-02-15 DIAGNOSIS — S9032XA Contusion of left foot, initial encounter: Secondary | ICD-10-CM | POA: Insufficient documentation

## 2015-02-15 MED ORDER — IBUPROFEN 200 MG PO TABS: 600.0000 mg | ORAL_TABLET | Freq: Three times a day (TID) | ORAL | Status: DC | PRN

## 2015-02-15 NOTE — ED Notes (Signed)
Pt. Arrived to ED from home with reports of been seen here yesterday for finger pain. Pt reports that she is not experiencing left foot pain denies injury. Pt alert and oriented.

## 2015-02-15 NOTE — ED Provider Notes (Signed)
[facility] Emergency Department Provider Note  ____________________________________________  Time seen: Approximately 10:32 AM  I have reviewed the triage vital signs and the nursing notes.   HISTORY  Chief Complaint Foot Pain   HPI Olivia Obrien is a 24 y.o. female presents to the ER for the complaint of left foot pain 1-2 days. Patient states that she was seen yesterday for right middle finger pain and bruising but states that that pain has fully improved and resolved presents today for a complaint of left foot pain at the base of the second and third toes with bruising. Patient states that the pain is mostly with touch or movement of toes.Patient states that she works at [employer] and she may have hit her foot but does not remember a specific injury. Denies fall or head injury.  States that her left second and third toes intermittently have felt tingly. However denies tingling or numbness at this time. Patient states that her left foot and toes have normal and full sensation. Denies pain radiation. States pain currently 2 out of 10 and aching.  Denies chest pain, shortness of breath, abdominal pain, nausea vomiting diarrhea, tingling, numbness, weakness or fall.   Reports she is 6 months postpartum.  Past Medical History  Diagnosis Date  . Hx of chlamydia infection   . Medical history non-contributory   reports Hx of HTN after pregnancy but states none since  Patient Active Problem List   Diagnosis Date Noted  . NSVD (normal spontaneous vaginal delivery) 08/14/2014  . Post term pregnancy 08/13/2014  . Post-term pregnancy, 40-42 weeks of gestation   . Small bowel obstruction, fetal, affecting care of mother, antepartum     Past Surgical History  Procedure Laterality Date  . Wisdom tooth extraction      Current Outpatient Rx          Refill                       0                2            2            0                         0            0               none  Allergies Bee venom and Dermoplast  Family History  Problem Relation Age of Onset  . Depression Mother   . Hypertension Mother   . Heart disease Father     clot in heart  . Seizures Father   . Tuberculosis Father   . Depression Father     Social History History  Substance Use Topics  . Smoking status: Never Smoker   . Smokeless tobacco: Not on file  . Alcohol Use: No    Review of Systems Constitutional: No fever/chills Eyes: No visual changes. ENT: No sore throat. Cardiovascular: Denies chest pain. Respiratory: Denies shortness of breath. Gastrointestinal: No abdominal pain.  No nausea, no vomiting.  No diarrhea.  No constipation. Genitourinary: Negative for dysuria. Musculoskeletal: Negative for back pain.positive for left foot pain as above Skin: Negative for rash. Neurological: Negative for headaches, focal weakness or numbness.  10-point ROS otherwise negative.  ____________________________________________   PHYSICAL EXAM:  VITAL SIGNS: ED Triage Vitals  Enc Vitals Group     BP 02/15/15 1016 139/88 mmHg     Pulse Rate 02/15/15 1016 79     Resp 02/15/15 1016 18     Temp 02/15/15 1016 98.1 F (36.7 C)     Temp Source 02/15/15 1016 Oral     SpO2 02/15/15 1016 100 %     Weight 02/15/15 1016 127 lb (57.607 kg)     Height 02/15/15 1016 5' (1.524 m)     Head Cir --      Peak Flow --      Pain Score 02/15/15 1017 2     Pain Loc --      Pain Edu? --      Excl. in GC? --    Blood pressure 117/77, pulse 67, temperature 98.2 F (36.8 C), temperature source Oral, resp. rate 18, height 5' (1.524 m), weight 127 lb (57.607 kg), last menstrual period 01/27/2015, SpO2 98 %, unknown if currently breastfeeding.  Constitutional: Alert and oriented. Well appearing and in no acute distress. Ambulatory in ER with steady gait.  Eyes: Conjunctivae are normal. PERRL. EOMI. Head: Atraumatic. Nose: No  congestion/rhinnorhea. Mouth/Throat: Mucous membranes are moist.  Oropharynx non-erythematous. Neck: No stridor.  No cervical spine tenderness to palpation. Hematological/Lymphatic/Immunilogical: No cervical lymphadenopathy. Cardiovascular: Normal rate, regular rhythm. Grossly normal heart sounds.  Good peripheral circulation. Respiratory: Normal respiratory effort.  No retractions. Lungs CTAB. Gastrointestinal: Soft and nontender. No distention. No abdominal bruits.  Musculoskeletal: No lower extremity tenderness nor edema.  No joint effusions.  Except: left foot base of 2 and 3 toes with mild ecchymosis and mild to mod TTP at this site, Full ROM, cap refill <2 secs, bilateral distal pedal pulses equal bilaterally and easily found. Dorsiflexion and plantar flexion strong and equal bilaterally. Sensation intact bilaterally. Bilateral lower extremities otherwise nontender. Very mild small areas of ecchymosis to right anterior leg, nontender.Steady gait. Hands grips equal bilaterally.    Neurologic:  Normal speech and language. No gross focal neurologic deficits are appreciated. Speech is normal. No gait instability. Skin:  Skin is warm, dry and intact. No rash noted. Psychiatric: Mood and affect are normal. Speech and behavior are normal.  ___________________________________  RADIOLOGY  LEFT FOOT - COMPLETE 3+ VIEW  COMPARISON: None.  FINDINGS: There is no evidence of fracture or dislocation. There is no evidence of arthropathy or other focal bone abnormality. Soft tissues are unremarkable.  IMPRESSION: Negative.   Electronically Signed By: Marnee Spring M.D. On: 02/15/2015 11:12 ____________________________________________   PROCEDURES  Procedure(s) performed: States she does not need splint or crutches. Refused splint and crutches.  ____________________________________________   INITIAL IMPRESSION / ASSESSMENT AND PLAN / ED COURSE  Pertinent labs & imaging  results that were available during my care of the patient were reviewed by me and considered in my medical decision making (see chart for details).  No acute distress. Well appearing. Presents to ER for complaint of left foot pain with bruising. Xray negative for acute changes. STates does not need crutches or splint. Ice and elevate. PRN ibuprofen. Follow up with PCP. Pt agreed to plan.  ____________________________________________   FINAL CLINICAL IMPRESSION(S) / ED DIAGNOSES  Final diagnoses:  Foot contusion, left, initial encounter      Renford Dills, NP 02/15/15 1134  Jene Every, MD 02/15/15 [phone]

## 2015-02-15 NOTE — Discharge Instructions (Signed)
Take ibuprofen as needed for pain. Apply ice and elevate. Avoid strenuous activity.   Follow-up with her primary care physician next week.  Return to the ER for new or worsening concerns.  Contusion A contusion is a deep bruise. Contusions are the result of an injury that caused bleeding under the skin. The contusion may turn blue, purple, or yellow. Minor injuries will give you a painless contusion, but more severe contusions may stay painful and swollen for a few weeks.  CAUSES  A contusion is usually caused by a blow, trauma, or direct force to an area of the body. SYMPTOMS   Swelling and redness of the injured area.  Bruising of the injured area.  Tenderness and soreness of the injured area.  Pain. DIAGNOSIS  The diagnosis can be made by taking a history and physical exam. An X-ray, CT scan, or MRI may be needed to determine if there were any associated injuries, such as fractures. TREATMENT  Specific treatment will depend on what area of the body was injured. In general, the best treatment for a contusion is resting, icing, elevating, and applying cold compresses to the injured area. Over-the-counter medicines may also be recommended for pain control. Ask your caregiver what the best treatment is for your contusion. HOME CARE INSTRUCTIONS   Put ice on the injured area.  Put ice in a plastic bag.  Place a towel between your skin and the bag.  Leave the ice on for 15-20 minutes, 3-4 times a day, or as directed by your health care provider.  Only take over-the-counter or prescription medicines for pain, discomfort, or fever as directed by your caregiver. Your caregiver may recommend avoiding anti-inflammatory medicines (aspirin, ibuprofen, and naproxen) for 48 hours because these medicines may increase bruising.  Rest the injured area.  If possible, elevate the injured area to reduce swelling. SEEK IMMEDIATE MEDICAL CARE IF:   You have increased bruising or swelling.  You  have pain that is getting worse.  Your swelling or pain is not relieved with medicines. MAKE SURE YOU:   Understand these instructions.  Will watch your condition.  Will get help right away if you are not doing well or get worse. Document Released: 06/07/2005 Document Revised: 09/02/2013 Document Reviewed: 07/03/2011 [facility] Patient Information 2015 [place]. This information is not intended to replace advice given to you by your health care provider. Make sure you discuss any questions you have with your health care provider.

## 2015-02-15 NOTE — ED Notes (Signed)
Noticed bruising on top of left foot

## 2015-02-15 NOTE — ED Notes (Signed)
Patient denies pain and is resting comfortably.  

## 2015-09-06 ENCOUNTER — Emergency Department
Admission: EM | Admit: 2015-09-06 | Discharge: 2015-09-06 | Disposition: A | Discharge status: Home / Self Care | Payer: Medicaid Other | Attending: Emergency Medicine | Admitting: Emergency Medicine

## 2015-09-06 ENCOUNTER — Encounter: Payer: Self-pay | Admitting: Emergency Medicine

## 2015-09-06 DIAGNOSIS — K0889 Other specified disorders of teeth and supporting structures: Secondary | ICD-10-CM

## 2015-09-06 DIAGNOSIS — Z79899 Other long term (current) drug therapy: Secondary | ICD-10-CM | POA: Insufficient documentation

## 2015-09-06 MED ORDER — HYDROCODONE-ACETAMINOPHEN 5-325 MG PO TABS: 1.0000 | ORAL_TABLET | ORAL | Status: DC | PRN

## 2015-09-06 MED ORDER — AMOXICILLIN 500 MG PO TABS: 500.0000 mg | ORAL_TABLET | Freq: Three times a day (TID) | ORAL | Status: DC

## 2015-09-06 NOTE — ED Provider Notes (Signed)
[facility] Emergency Department Provider Note  ____________________________________________  Time seen: Approximately 06:47 PM  I have reviewed the triage vital signs and the nursing notes.   HISTORY  Chief Complaint Dental Pain  HPI Olivia Obrien is a 24 y.o. female whopresents to the emergency department for evaluation of dental pain. Her dentist is closed for the week. Pain has been present for the past 3 months. She has been using a water pick and trying to "take good care of it" but the pain is increasing. No relief with OTC medication.   Past Medical History  Diagnosis Date  . Hx of chlamydia infection   . Medical history non-contributory     Patient Active Problem List   Diagnosis Date Noted  . NSVD (normal spontaneous vaginal delivery) 08/14/2014  . Post term pregnancy 08/13/2014  . Post-term pregnancy, 40-42 weeks of gestation   . Small bowel obstruction, fetal, affecting care of mother, antepartum     Past Surgical History  Procedure Laterality Date  . Wisdom tooth extraction      Current Outpatient Rx  Name  Route  Sig  Dispense  Refill  . acetaminophen (TYLENOL) 325 MG tablet   Oral   Take 650 mg by mouth every 6 (six) hours as needed.         Marland Kitchen amLODipine (NORVASC) 2.5 MG tablet   Oral   Take 2 tablets (5 mg total) by mouth daily.   40 tablet   0      tablets are fine, Could not get Epic to pull u ...   . amoxicillin (AMOXIL) 500 MG tablet   Oral   Take 1 tablet (500 mg total) by mouth 3 (three) times daily.   30 tablet   0   . clobetasol ointment (TEMOVATE) 0.05 %   Topical   Apply 1 application topically 2 (two) times daily. Apply to affected area   30 g   2   . docusate sodium (COLACE) 100 MG capsule   Oral   Take 1 capsule (100 mg total) by mouth 2 (two) times daily as needed. Patient not taking: Reported on 08/24/2014   30 capsule   2   . hydrochlorothiazide (HYDRODIURIL) 25 MG tablet   Oral  Take 1 tablet (25 mg total) by mouth daily.   20 tablet   0   . HYDROcodone-acetaminophen (NORCO/VICODIN) 5-325 MG tablet   Oral   Take 1 tablet by mouth every 4 (four) hours as needed.   12 tablet   0   . hydrocortisone-pramoxine (PROCTOFOAM-HC) rectal foam   Rectal   Place 1 applicator rectally 2 (two) times daily.         Marland Kitchen ibuprofen (MOTRIN IB) 200 MG tablet   Oral   Take 3 tablets (600 mg total) by mouth every 8 (eight) hours as needed for mild pain or moderate pain.   15 tablet   0   . loratadine (CLARITIN) 10 MG tablet   Oral   Take 1 tablet (10 mg total) by mouth daily.   30 tablet   0   . Prenatal Vit-Fe Fumarate-FA (PRENATAL MULTIVITAMIN) TABS tablet   Oral   Take 1 tablet by mouth daily at 12 noon.           Allergies Bee venom and Dermoplast  Family History  Problem Relation Age of Onset  . Depression Mother   . Hypertension Mother   . Heart disease Father     clot  in heart  . Seizures Father   . Tuberculosis Father   . Depression Father     Social History Social History  Substance Use Topics  . Smoking status: Never Smoker   . Smokeless tobacco: None  . Alcohol Use: No    Review of Systems Constitutional: No fever/chills Eyes: No visual changes. ENT: No sore throat. Cardiovascular: Denies chest pain. Respiratory: Denies shortness of breath. Gastrointestinal: No abdominal pain.  No nausea, no vomiting.  Genitourinary: Negative for dysuria. Musculoskeletal: Negative for back pain. Skin: Negative for rash. Neurological: Negative for headaches, focal weakness or numbness. 10-point ROS otherwise negative.  ____________________________________________   PHYSICAL EXAM:  VITAL SIGNS: ED Triage Vitals  Enc Vitals Group     BP 09/06/15 1804 124/88 mmHg     Pulse Rate 09/06/15 1804 99     Resp 09/06/15 1804 16     Temp 09/06/15 1804 98.2 F (36.8 C)     Temp Source 09/06/15 1804 Oral     SpO2 09/06/15 1804 98 %     Weight  09/06/15 1807 115 lb 1.6 oz (52.209 kg)     Height 09/06/15 1804 5' (1.524 m)     Head Cir --      Peak Flow --      Pain Score 09/06/15 1805 9     Pain Loc --      Pain Edu? --      Excl. in GC? --     Constitutional: Alert and oriented. Well appearing and in no acute distress. Eyes: Conjunctivae are normal. PERRL. EOMI. Head: Atraumatic. Nose: No congestion/rhinnorhea. Mouth/Throat: Mucous membranes are moist.  Oropharynx non-erythematous. Periodontal Exam    Neck: No stridor.  Hematological/Lymphatic/Immunilogical: No cervical lymphadenopathy. Cardiovascular:   Good peripheral circulation. Respiratory: Normal respiratory effort.  No retractions. Musculoskeletal: No lower extremity tenderness nor edema.  No joint effusions. Neurologic:  Normal speech and language. No gross focal neurologic deficits are appreciated. Speech is normal. No gait instability. Skin:  Skin is warm, dry and intact. No rash noted. Psychiatric: Mood and affect are normal. Speech and behavior are normal.  ____________________________________________   LABS (all labs ordered are listed, but only abnormal results are displayed)  Labs Reviewed - No data to display ____________________________________________   RADIOLOGY  Not indicated. ____________________________________________   PROCEDURES  Procedure(s) performed: None  Critical Care performed: No  ____________________________________________   INITIAL IMPRESSION / ASSESSMENT AND PLAN / ED COURSE  Pertinent labs & imaging results that were available during my care of the patient were reviewed by me and considered in my medical decision making (see chart for details).  Patient was advised to see the dentist within 14 days. Also advised to take the antibiotic until finished. Instructed to return to the ER for symptoms that change or worsen if you are unable to schedule an  appointment. ____________________________________________   FINAL CLINICAL IMPRESSION(S) / ED DIAGNOSES  Final diagnoses:  Pain, dental       Chinita PesterCari B Liseth Wann, FNP 09/06/15 2316  Darien Ramusavid W Kaminski, MD 09/06/15 [phone]

## 2015-09-06 NOTE — ED Notes (Signed)
Pt c/o upper left tooth pain X 3 months. Has had some cavities filled but this one has not been done yet. No drainage.

## 2015-09-06 NOTE — ED Notes (Signed)
Pt has left upper toothache for 2 days.  Taking aleve without relief.  swwelling to left side of face.

## 2015-10-09 ENCOUNTER — Emergency Department
Admission: EM | Admit: 2015-10-09 | Discharge: 2015-10-09 | Disposition: A | Discharge status: Home / Self Care | Payer: Self-pay | Attending: Emergency Medicine | Admitting: Emergency Medicine

## 2015-10-09 ENCOUNTER — Encounter: Payer: Self-pay | Admitting: Emergency Medicine

## 2015-10-09 DIAGNOSIS — L252 Unspecified contact dermatitis due to dyes: Secondary | ICD-10-CM

## 2015-10-09 DIAGNOSIS — Z792 Long term (current) use of antibiotics: Secondary | ICD-10-CM | POA: Insufficient documentation

## 2015-10-09 DIAGNOSIS — L2489 Irritant contact dermatitis due to other agents: Secondary | ICD-10-CM

## 2015-10-09 DIAGNOSIS — Z7952 Long term (current) use of systemic steroids: Secondary | ICD-10-CM | POA: Insufficient documentation

## 2015-10-09 DIAGNOSIS — Z79899 Other long term (current) drug therapy: Secondary | ICD-10-CM | POA: Insufficient documentation

## 2015-10-09 DIAGNOSIS — F17210 Nicotine dependence, cigarettes, uncomplicated: Secondary | ICD-10-CM | POA: Insufficient documentation

## 2015-10-09 MED ORDER — FAMOTIDINE 20 MG PO TABS
40.0000 mg | ORAL_TABLET | Freq: Once | ORAL | Status: AC
  Administered 2015-10-09: 40 mg via ORAL
  Filled 2015-10-09: qty 2

## 2015-10-09 MED ORDER — CYPROHEPTADINE HCL 4 MG PO TABS
4.0000 mg | ORAL_TABLET | Freq: Once | ORAL | Status: AC
  Administered 2015-10-09: 4 mg via ORAL
  Filled 2015-10-09: qty 1

## 2015-10-09 MED ORDER — DEXAMETHASONE SODIUM PHOSPHATE 10 MG/ML IJ SOLN
10.0000 mg | Freq: Once | INTRAMUSCULAR | Status: AC
  Administered 2015-10-09: 10 mg via INTRAMUSCULAR
  Filled 2015-10-09: qty 1

## 2015-10-09 MED ORDER — CYPROHEPTADINE HCL 4 MG PO TABS: 4.0000 mg | ORAL_TABLET | Freq: Three times a day (TID) | ORAL | Status: DC | PRN

## 2015-10-09 MED ORDER — RANITIDINE HCL 150 MG PO TABS: 150.0000 mg | ORAL_TABLET | Freq: Two times a day (BID) | ORAL | Status: DC

## 2015-10-09 MED ORDER — PREDNISONE 10 MG PO TABS: 10.0000 mg | ORAL_TABLET | Freq: Two times a day (BID) | ORAL | Status: DC

## 2015-10-09 NOTE — ED Notes (Signed)
Patient reports had hair dyed for the first time yesterday and last night noted itching, pain and redness to scalp and ears.

## 2015-10-09 NOTE — Discharge Instructions (Signed)
Contact Dermatitis °Dermatitis is redness, soreness, and swelling (inflammation) of the skin. Contact dermatitis is a reaction to certain substances that touch the skin. There are two types of contact dermatitis:  °· Irritant contact dermatitis. This type is caused by something that irritates your skin, such as dry hands from washing them too much. This type does not require previous exposure to the substance for a reaction to occur. This type is more common. °· Allergic contact dermatitis. This type is caused by a substance that you are allergic to, such as a nickel allergy or poison ivy. This type only occurs if you have been exposed to the substance (allergen) before. Upon a repeat exposure, your body reacts to the substance. This type is less common. °CAUSES  °Many different substances can cause contact dermatitis. Irritant contact dermatitis is most commonly caused by exposure to:  °· Makeup.   °· Soaps.   °· Detergents.   °· Bleaches.   °· Acids.   °· Metal salts, such as nickel.   °Allergic contact dermatitis is most commonly caused by exposure to:  °· Poisonous plants.   °· Chemicals.   °· Jewelry.   °· Latex.   °· Medicines.   °· Preservatives in products, such as clothing.   °RISK FACTORS °This condition is more likely to develop in:  °· People who have jobs that expose them to irritants or allergens. °· People who have certain medical conditions, such as asthma or eczema.   °SYMPTOMS  °Symptoms of this condition may occur anywhere on your body where the irritant has touched you or is touched by you. Symptoms include: °· Dryness or flaking.   °· Redness.   °· Cracks.   °· Itching.   °· Pain or a burning feeling.   °· Blisters. °· Drainage of small amounts of blood or clear fluid from skin cracks. °With allergic contact dermatitis, there may also be swelling in areas such as the eyelids, mouth, or genitals.  °DIAGNOSIS  °This condition is diagnosed with a medical history and physical exam. A patch skin test  may be performed to help determine the cause. If the condition is related to your job, you may need to see an occupational medicine specialist. °TREATMENT °Treatment for this condition includes figuring out what caused the reaction and protecting your skin from further contact. Treatment may also include:  °· Steroid creams or ointments. Oral steroid medicines may be needed in more severe cases. °· Antibiotics or antibacterial ointments, if a skin infection is present. °· Antihistamine lotion or an antihistamine taken by mouth to ease itching. °· A bandage (dressing). °HOME CARE INSTRUCTIONS °Skin Care  °· Moisturize your skin as needed.   °· Apply cool compresses to the affected areas. °· Try taking a bath with: °¨ Epsom salts. Follow the instructions on the packaging. You can get these at your local pharmacy or grocery store. °¨ Baking soda. Pour a small amount into the bath as directed by your health care provider. °¨ Colloidal oatmeal. Follow the instructions on the packaging. You can get this at your local pharmacy or grocery store. °· Try applying baking soda paste to your skin. Stir water into baking soda until it reaches a paste-like consistency. °· Do not scratch your skin. °· Bathe less frequently, such as every other day. °· Bathe in lukewarm water. Avoid using hot water. °Medicines  °· Take or apply over-the-counter and prescription medicines only as told by your health care provider.   °· If you were prescribed an antibiotic medicine, take or apply your antibiotic as told by your health care provider. Do not stop using the   antibiotic even if your condition starts to improve. General Instructions  Keep all follow-up visits as told by your health care provider. This is important.  Avoid the substance that caused your reaction. If you do not know what caused it, keep a journal to try to track what caused it. Write down:  What you eat.  What cosmetic products you use.  What you drink.  What  you wear in the affected area. This includes jewelry.  If you were given a dressing, take care of it as told by your health care provider. This includes when to change and remove it. SEEK MEDICAL CARE IF:   Your condition does not improve with treatment.  Your condition gets worse.  You have signs of infection such as swelling, tenderness, redness, soreness, or warmth in the affected area.  You have a fever.  You have new symptoms. SEEK IMMEDIATE MEDICAL CARE IF:   You have a severe headache, neck pain, or neck stiffness.  You vomit.  You feel very sleepy.  You notice red streaks coming from the affected area.  Your bone or joint underneath the affected area becomes painful after the skin has healed.  The affected area turns darker.  You have difficulty breathing.   This information is not intended to replace advice given to you by your health care provider. Make sure you discuss any questions you have with your health care provider.   Document Released: 08/25/2000 Document Revised: 05/19/2015 Document Reviewed: 01/13/2015 Elsevier Interactive Patient Education 2016 [employer].   You have sustained a dermatitis due to contact with hair dye/products. You should take the prescription meds as directed. You should apply the previously prescribed steroid ointment to the hairline, neck and ears once to twice daily. Avoid hot baths or water to the head or scalp until symptoms resolve. Follow-up with your provider for ongoing symptoms. Return to the ED as needed for evaluation of any open sores or weeping skin.

## 2015-10-09 NOTE — ED Notes (Signed)
Pt states that she has taken 7 benadryl today.  Had hair dyed yesterday and developed skin irritation.  No burning upon application.  Started 0530 this AM.  Denies use of new soap or shampoo.  Has also used some prescription strength cortisone cream.  States she has having hearing issues in L ear.  States that area feels like it is swelling.

## 2015-10-09 NOTE — ED Notes (Signed)
Pt discharged to home.  Discharge instructions reviewed.  Verbalized understanding.  No questions or concerns at this time.  Teach back verified.  Pt in NAD.  No items left in ED.   

## 2015-10-09 NOTE — ED Provider Notes (Signed)
[facility] Emergency Department Provider Note ____________________________________________  Time seen: 2035  I have reviewed the triage vital signs and the nursing notes.  HISTORY  Chief Complaint  Allergic Reaction  HPI Olivia Obrien is a 25 y.o. female this is the ED for evaluation of irritation to the hairline after she received a professional haircoloring treatment yesterday at a local salon.She describes a history of sensitive skin, but failed to notify the hairdresser that she may be sensitive to products since this was her first time have a [employer]. She noted onset of skin irritation to the back of the neck at the hairline at about 5 am this morning. Since that time, she has experienced irritation to the ears, temples, and forehead. She denies any fevers, chills, sweats, or weeping wounds. She denies any difficulty breathing or mucous membrane involvement. She notes some soothing relief using a previously prescribed steroid ointment. She denies any lasting relief with Benadryl. She rates her discomfort at 8/10 in triage.   Past Medical History  Diagnosis Date  . Hx of chlamydia infection   . Medical history non-contributory     Patient Active Problem List   Diagnosis Date Noted  . NSVD (normal spontaneous vaginal delivery) 08/14/2014  . Post term pregnancy 08/13/2014  . Post-term pregnancy, 40-42 weeks of gestation   . Small bowel obstruction, fetal, affecting care of mother, antepartum     Past Surgical History  Procedure Laterality Date  . Wisdom tooth extraction      Current Outpatient Rx  Name  Route  Sig  Dispense  Refill  . acetaminophen (TYLENOL) 325 MG tablet   Oral   Take 650 mg by mouth every 6 (six) hours as needed.         Marland Kitchen amLODipine (NORVASC) 2.5 MG tablet   Oral   Take 2 tablets (5 mg total) by mouth daily.   40 tablet   0      tablets are fine, Could not get Epic to pull u ...   . amoxicillin (AMOXIL) 500  MG tablet   Oral   Take 1 tablet (500 mg total) by mouth 3 (three) times daily.   30 tablet   0   . clobetasol ointment (TEMOVATE) 0.05 %   Topical   Apply 1 application topically 2 (two) times daily. Apply to affected area   30 g   2   . cyproheptadine (PERIACTIN) 4 MG tablet   Oral   Take 1 tablet (4 mg total) by mouth 3 (three) times daily as needed for allergies.   30 tablet   0   . docusate sodium (COLACE) 100 MG capsule   Oral   Take 1 capsule (100 mg total) by mouth 2 (two) times daily as needed. Patient not taking: Reported on 08/24/2014   30 capsule   2   . hydrochlorothiazide (HYDRODIURIL) 25 MG tablet   Oral   Take 1 tablet (25 mg total) by mouth daily.   20 tablet   0   . HYDROcodone-acetaminophen (NORCO/VICODIN) 5-325 MG tablet   Oral   Take 1 tablet by mouth every 4 (four) hours as needed.   12 tablet   0   . hydrocortisone-pramoxine (PROCTOFOAM-HC) rectal foam   Rectal   Place 1 applicator rectally 2 (two) times daily.         Marland Kitchen ibuprofen (MOTRIN IB) 200 MG tablet   Oral   Take 3 tablets (600 mg total) by mouth every 8 (  eight) hours as needed for mild pain or moderate pain.   15 tablet   0   . loratadine (CLARITIN) 10 MG tablet   Oral   Take 1 tablet (10 mg total) by mouth daily.   30 tablet   0   . predniSONE (DELTASONE) 10 MG tablet   Oral   Take 1 tablet (10 mg total) by mouth 2 (two) times daily with a meal.   10 tablet   0   . Prenatal Vit-Fe Fumarate-FA (PRENATAL MULTIVITAMIN) TABS tablet   Oral   Take 1 tablet by mouth daily at 12 noon.         . ranitidine (ZANTAC) 150 MG tablet   Oral   Take 1 tablet (150 mg total) by mouth 2 (two) times daily.   20 tablet   0     Allergies Bee venom and Dermoplast  Family History  Problem Relation Age of Onset  . Depression Mother   . Hypertension Mother   . Heart disease Father     clot in heart  . Seizures Father   . Tuberculosis Father   . Depression Father     Social  History Social History  Substance Use Topics  . Smoking status: Current Some Day Smoker    Types: Cigarettes  . Smokeless tobacco: None     Comment: Occasionally 1 cigarette / week  . Alcohol Use: Yes   Review of Systems  Constitutional: Negative for fever. Eyes: Negative for visual changes. ENT: Negative for sore throat. Cardiovascular: Negative for chest pain. Respiratory: Negative for shortness of breath. Gastrointestinal: Negative for abdominal pain, vomiting and diarrhea. Genitourinary: Negative for dysuria. Musculoskeletal: Negative for back pain. Skin: Positive for rash. Neurological: Negative for headaches, focal weakness or numbness. ____________________________________________  PHYSICAL EXAM:  VITAL SIGNS: ED Triage Vitals  Enc Vitals Group     BP 10/09/15 1918 122/83 mmHg     Pulse Rate 10/09/15 1918 84     Resp 10/09/15 1918 20     Temp 10/09/15 1918 97.8 F (36.6 C)     Temp Source 10/09/15 1918 Oral     SpO2 10/09/15 1918 100 %     Weight 10/09/15 1918 115 lb (52.164 kg)     Height 10/09/15 1918 5' (1.524 m)     Head Cir --      Peak Flow --      Pain Score 10/09/15 1919 9     Pain Loc --      Pain Edu? --      Excl. in GC? --    Constitutional: Alert and oriented. Well appearing and in no distress. Head: Normocephalic and atraumatic.      Eyes: Conjunctivae are normal. PERRL. Normal extraocular movements      Ears: Canals clear. TMs intact bilaterally.   Nose: No congestion/rhinorrhea.   Mouth/Throat: Mucous membranes are moist.   Neck: Supple. No thyromegaly. Hematological/Lymphatic/Immunological: No cervical lymphadenopathy. Cardiovascular: Normal rate, regular rhythm.  Respiratory: Normal respiratory effort. No wheezes/rales/rhonchi. Gastrointestinal: Soft and nontender. No distention. Musculoskeletal: Nontender with normal range of motion in all extremities.  Neurologic:  Normal gait without ataxia. Normal speech and language. No  gross focal neurologic deficits are appreciated. Skin:  Skin is warm, dry and intact. Patient with well-demarcated maculopapular erythematous skin around the base and nape of the neck. She also has extension around the hairline at the temples and ears. The forehead also exhibits similar skin changes. There is no appreciable blistering, weeping, peeling,  scaling, or crusting. Psychiatric: Mood and affect are normal. Patient exhibits appropriate insight and judgment. ____________________________________________  PROCEDURES  Decadron 10 mg IM Famotidine 40 mg PO Periactin 4 mg PO ____________________________________________  INITIAL IMPRESSION / ASSESSMENT AND PLAN / ED COURSE  Patient with an acute irritant contact dermatitis secondary to hair dye products. She is discharged with a prescription for Pepcid and famotidine. She is also provided with a prescription for prednisone to dose as directed. She is encouraged to use to previous the prescribed clobetasol ointment for topical application. She will continue to monitor symptoms and follow with a primary provider for any progression or worsening of her skin irritation. Return precautions are provided. ____________________________________________  FINAL CLINICAL IMPRESSION(S) / ED DIAGNOSES  Final diagnoses:  Contact dermatitis due to dye  Irritant contact dermatitis due to dyes      Lissa Hoard, PA-C 10/09/15 2155  Jennye Moccasin, MD 10/09/15 2156

## 2015-10-12 ENCOUNTER — Emergency Department
Admission: EM | Admit: 2015-10-12 | Discharge: 2015-10-12 | Disposition: A | Discharge status: Home / Self Care | Payer: Medicaid Other | Attending: Emergency Medicine | Admitting: Emergency Medicine

## 2015-10-12 ENCOUNTER — Encounter: Payer: Self-pay | Admitting: Emergency Medicine

## 2015-10-12 DIAGNOSIS — L252 Unspecified contact dermatitis due to dyes: Secondary | ICD-10-CM | POA: Insufficient documentation

## 2015-10-12 DIAGNOSIS — Z79899 Other long term (current) drug therapy: Secondary | ICD-10-CM | POA: Insufficient documentation

## 2015-10-12 DIAGNOSIS — F17210 Nicotine dependence, cigarettes, uncomplicated: Secondary | ICD-10-CM | POA: Insufficient documentation

## 2015-10-12 DIAGNOSIS — Z792 Long term (current) use of antibiotics: Secondary | ICD-10-CM | POA: Insufficient documentation

## 2015-10-12 DIAGNOSIS — T490X5A Adverse effect of local antifungal, anti-infective and anti-inflammatory drugs, initial encounter: Secondary | ICD-10-CM | POA: Insufficient documentation

## 2015-10-12 DIAGNOSIS — L03211 Cellulitis of face: Secondary | ICD-10-CM | POA: Insufficient documentation

## 2015-10-12 DIAGNOSIS — Z7952 Long term (current) use of systemic steroids: Secondary | ICD-10-CM | POA: Insufficient documentation

## 2015-10-12 MED ORDER — MUPIROCIN 2 % EX OINT: TOPICAL_OINTMENT | CUTANEOUS | Status: DC

## 2015-10-12 MED ORDER — TRAMADOL HCL 50 MG PO TABS
50.0000 mg | ORAL_TABLET | Freq: Once | ORAL | Status: AC
  Administered 2015-10-12: 50 mg via ORAL
  Filled 2015-10-12: qty 1

## 2015-10-12 MED ORDER — CEPHALEXIN 500 MG PO CAPS
500.0000 mg | ORAL_CAPSULE | Freq: Once | ORAL | Status: AC
  Administered 2015-10-12: 500 mg via ORAL
  Filled 2015-10-12: qty 1

## 2015-10-12 MED ORDER — CEPHALEXIN 500 MG PO CAPS: 500.0000 mg | ORAL_CAPSULE | Freq: Four times a day (QID) | ORAL | Status: DC

## 2015-10-12 MED ORDER — TRAMADOL HCL 50 MG PO TABS: 50.0000 mg | ORAL_TABLET | Freq: Two times a day (BID) | ORAL | Status: DC

## 2015-10-12 NOTE — Discharge Instructions (Signed)
Cellulitis Cellulitis is an infection of the skin and the tissue under the skin. The infected area is usually red and tender. This happens most often in the arms and lower legs. HOME CARE   Take your antibiotic medicine as told. Finish the medicine even if you start to feel better.  Keep the infected arm or leg raised (elevated).  Put a warm cloth on the area up to 4 times per day.  Only take medicines as told by your doctor.  Keep all doctor visits as told. GET HELP IF:  You see red streaks on the skin coming from the infected area.  Your red area gets bigger or turns a dark color.  Your bone or joint under the infected area is painful after the skin heals.  Your infection comes back in the same area or different area.  You have a puffy (swollen) bump in the infected area.  You have new symptoms.  You have a fever. GET HELP RIGHT AWAY IF:   You feel very sleepy.  You throw up (vomit) or have watery poop (diarrhea).  You feel sick and have muscle aches and pains.   This information is not intended to replace advice given to you by your health care provider. Make sure you discuss any questions you have with your health care provider.   Document Released: 02/14/2008 Document Revised: 05/19/2015 Document Reviewed: 11/13/2011 Elsevier Interactive Patient Education 2016 Elsevier Inc[employer]  Contact Dermatitis Dermatitis is redness, soreness, and swelling (inflammation) of the skin. Contact dermatitis is a reaction to certain substances that touch the skin. You either touched something that irritated your skin, or you have allergies to something you touched.  HOME CARE  Skin Care  Moisturize your skin as needed.  Apply cool compresses to the affected areas.   Try taking a bath with:   Epsom salts. Follow the instructions on the package. You can get these at a pharmacy or grocery store.   Baking soda. Pour a small amount into the bath as told by your doctor.    Colloidal oatmeal. Follow the instructions on the package. You can get this at a pharmacy or grocery store.   Try applying baking soda paste to your skin. Stir water into baking soda until it looks like paste.  Do not scratch your skin.   Bathe less often.  Bathe in lukewarm water. Avoid using hot water.  Medicines  Take or apply over-the-counter and prescription medicines only as told by your doctor.   If you were prescribed an antibiotic medicine, take or apply your antibiotic as told by your doctor. Do not stop taking the antibiotic even if your condition starts to get better. General Instructions  Keep all follow-up visits as told by your doctor. This is important.   Avoid the substance that caused your reaction. If you do not know what caused it, keep a journal to try to track what caused it. Write down:   What you eat.   What cosmetic products you use.   What you drink.   What you wear in the affected area. This includes jewelry.   If you were given a bandage (dressing), take care of it as told by your doctor. This includes when to change and remove it.  GET HELP IF:   You do not get better with treatment.   Your condition gets worse.   You have signs of infection such as:  Swelling.  Tenderness.  Redness.  Soreness.  Warmth.   You  have a fever.   You have new symptoms.  GET HELP RIGHT AWAY IF:   You have a very bad headache.  You have neck pain.  Your neck is stiff.   You throw up (vomit).   You feel very sleepy.   You see red streaks coming from the affected area.   Your bone or joint underneath the affected area becomes painful after the skin has healed.   The affected area turns darker.   You have trouble breathing.    This information is not intended to replace advice given to you by your health care provider. Make sure you discuss any questions you have with your health care provider.   Document Released:  06/25/2009 Document Revised: 05/19/2015 Document Reviewed: 01/13/2015 Elsevier Interactive Patient Education 2016 [employer] Inc[employer]  Continue to dose the previously prescribed medicines for itch and inflammation. Apply the antibiotic ointment as directed. Take the antibiotic pills until complete. Follow-up with [employer] or return as needed for worsening symptoms.

## 2015-10-12 NOTE — ED Notes (Signed)
Patient presents to the ED with facial swelling, lymph node swelling and rash around her hairline.  Patient has had rash since Friday when her hair was dyed for the first time.  Patient's left eye appears swollen.  Patient reports her lymph nodes are swollen and tender as well.  Patient is in no obvious distress at this time.  Denies any difficulty breathing.  Patient has taken a total of  of benadryl today, ranitidine,  and  of prednisone prior to arrival.  Patient reports swollen areas and rash are very painful.

## 2015-10-13 ENCOUNTER — Emergency Department (HOSPITAL_COMMUNITY)
Admission: EM | Admit: 2015-10-13 | Discharge: 2015-10-13 | Disposition: A | Discharge status: Home / Self Care | Payer: Medicaid Other | Attending: Emergency Medicine | Admitting: Emergency Medicine

## 2015-10-13 ENCOUNTER — Encounter (HOSPITAL_COMMUNITY): Payer: Self-pay | Admitting: Emergency Medicine

## 2015-10-13 DIAGNOSIS — Z79899 Other long term (current) drug therapy: Secondary | ICD-10-CM | POA: Insufficient documentation

## 2015-10-13 DIAGNOSIS — L259 Unspecified contact dermatitis, unspecified cause: Secondary | ICD-10-CM

## 2015-10-13 DIAGNOSIS — F17210 Nicotine dependence, cigarettes, uncomplicated: Secondary | ICD-10-CM | POA: Insufficient documentation

## 2015-10-13 DIAGNOSIS — L03211 Cellulitis of face: Secondary | ICD-10-CM | POA: Insufficient documentation

## 2015-10-13 DIAGNOSIS — Z792 Long term (current) use of antibiotics: Secondary | ICD-10-CM | POA: Insufficient documentation

## 2015-10-13 DIAGNOSIS — Z8619 Personal history of other infectious and parasitic diseases: Secondary | ICD-10-CM | POA: Insufficient documentation

## 2015-10-13 DIAGNOSIS — Z7952 Long term (current) use of systemic steroids: Secondary | ICD-10-CM | POA: Insufficient documentation

## 2015-10-13 DIAGNOSIS — R0602 Shortness of breath: Secondary | ICD-10-CM | POA: Insufficient documentation

## 2015-10-13 NOTE — ED Notes (Signed)
ED PA at the bedside 

## 2015-10-13 NOTE — ED Notes (Addendum)
Pt from home for eval of facial swelling that started yesterday and has gotten worse today, pt states some sob and dizziness at home. Denies sob or dizziness now. Pt also reports rash and to face and neck since Friday after dying her hair professionally for the first time, has had minimal relief from meds given. Pt noted to be on cephaloxin but uanble to say why. No respiratory distress at this time.

## 2015-10-13 NOTE — ED Provider Notes (Signed)
CSN: [id number]     Arrival date & time 10/13/15  1805 History   First MD Initiated Contact with Patient 10/13/15 1958     Chief Complaint  Patient presents with  . Allergic Reaction  . Facial Swelling     (Consider location/radiation/quality/duration/timing/severity/associated sxs/prior Treatment) Patient is a 25 y.o. female presenting with allergic reaction. The history is provided by the patient and medical records. No language interpreter was used.  Allergic Reaction Presenting symptoms: no difficulty swallowing, no rash and no wheezing    [place] LY WASS is a 25 y.o. female  who presents to the Emergency Department complaining of persistent facial swelling. Patient had allergic reaction after dying hair for the first time on 1/28. She went to ER on 1/28 and was given decadron shot, pepcid, and cyproheptadine - dc'd with prednisone, zantac, and cyproheptadine rx's. Seen again in ED on 1/31 where it appears rash looked infected - was started on keflex and bactroban, given ultram for pain controlled and discharged. Today, she states lymph nodes still hurt and eyes are swollen. Has been compliant with treatment. States that she "has to work harder to breath" but not sob at this time. Denies fever.   Past Medical History  Diagnosis Date  . Hx of chlamydia infection   . Medical history non-contributory    Past Surgical History  Procedure Laterality Date  . Wisdom tooth extraction     Family History  Problem Relation Age of Onset  . Depression Mother   . Hypertension Mother   . Heart disease Father     clot in heart  . Seizures Father   . Tuberculosis Father   . Depression Father    Social History  Substance Use Topics  . Smoking status: Current Some Day Smoker    Types: Cigarettes  . Smokeless tobacco: None     Comment: Occasionally 1 cigarette / week  . Alcohol Use: Yes   OB History    Gravida Para Term Preterm AB TAB SAB Ectopic Multiple Living   1 1 1       0 1      Review of Systems  Constitutional: Negative for fever and chills.  HENT: Positive for facial swelling. Negative for congestion and trouble swallowing.   Eyes: Negative for visual disturbance.  Respiratory: Positive for shortness of breath. Negative for cough, chest tightness, wheezing and stridor.   Cardiovascular: Negative.   Gastrointestinal: Negative for nausea, vomiting and abdominal pain.  Genitourinary: Negative for dysuria.  Musculoskeletal: Negative for back pain and neck pain.  Skin: Negative for rash.  Neurological: Negative for dizziness, weakness and headaches.      Allergies  Mupirocin; Other; Bee venom; and Dermoplast  Home Medications   Prior to Admission medications   Medication Sig Start Date End Date Taking? Authorizing Provider  cephALEXin (KEFLEX) 500 MG capsule Take 1 capsule (500 mg total) by mouth 4 (four) times daily. Patient taking differently: Take 500 mg by mouth 4 (four) times daily. Started 10/12/15, for 7 days ending 10/17/15 10/12/15  Yes Jenise V Bacon Menshew, PA-C  clobetasol ointment (TEMOVATE) 0.05 % Apply 1 application topically 2 (two) times daily. Apply to affected area Patient taking differently: Apply 1 application topically daily as needed (for irritation). Apply to affected area 08/20/14  Yes Aviva Signs, CNM  cyproheptadine (PERIACTIN) 4 MG tablet Take 1 tablet (4 mg total) by mouth 3 (three) times daily as needed for allergies. 10/09/15  Yes Jenise V Bacon Menshew, PA-C  neomycin-bacitracin-polymyxin (  NEOSPORIN) OINT Apply 1 application topically daily as needed for irritation or wound care.   Yes Historical Provider, MD  predniSONE (DELTASONE) 10 MG tablet Take 1 tablet (10 mg total) by mouth 2 (two) times daily with a meal. 10/09/15  Yes Jenise V Bacon Menshew, PA-C  ranitidine (ZANTAC) 150 MG tablet Take 1 tablet (150 mg total) by mouth 2 (two) times daily. Patient taking differently: Take 150 mg by mouth daily as needed for heartburn.   10/09/15  Yes Jenise V Bacon Menshew, PA-C  traMADol (ULTRAM) 50 MG tablet Take 1 tablet (50 mg total) by mouth 2 (two) times daily. Patient taking differently: Take 50 mg by mouth every 12 (twelve) hours as needed for severe pain.  10/12/15  Yes Jenise V Bacon Menshew, PA-C  amLODipine (NORVASC) 2.5 MG tablet Take 2 tablets (5 mg total) by mouth daily. 08/20/14   Aviva Signs, CNM  amoxicillin (AMOXIL) 500 MG tablet Take 1 tablet (500 mg total) by mouth 3 (three) times daily. 09/06/15   Chinita Pester, FNP  docusate sodium (COLACE) 100 MG capsule Take 1 capsule (100 mg total) by mouth 2 (two) times daily as needed. Patient not taking: Reported on 08/24/2014 08/17/14   Misty Stanley A Leftwich-Kirby, CNM  hydrochlorothiazide (HYDRODIURIL) 25 MG tablet Take 1 tablet (25 mg total) by mouth daily. 08/20/14   Aviva Signs, CNM  HYDROcodone-acetaminophen (NORCO/VICODIN) 5-325 MG tablet Take 1 tablet by mouth every 4 (four) hours as needed. 09/06/15   Chinita Pester, FNP  ibuprofen (MOTRIN IB) 200 MG tablet Take 3 tablets (600 mg total) by mouth every 8 (eight) hours as needed for mild pain or moderate pain. 02/15/15   Renford Dills, NP  loratadine (CLARITIN) 10 MG tablet Take 1 tablet (10 mg total) by mouth daily. 08/20/14   Aviva Signs, CNM  mupirocin ointment (BACTROBAN) 2 % Apply to affected area 3 times daily 10/12/15   Smith Robert Bacon Menshew, PA-C   BP 110/75 mmHg  Pulse 72  Temp(Src) 98.2 F (36.8 C) (Oral)  Resp 18  Ht 5' (1.524 m)  Wt 53.524 kg  BMI 23.05 kg/m2  SpO2 97%  LMP 09/27/2015 (Exact Date) Physical Exam  Constitutional: She is oriented to person, place, and time. She appears well-developed and well-nourished.  Alert, speaking in full sentences, and in no acute distress  HENT:  Head: Normocephalic and atraumatic.  Mouth/Throat: No oropharyngeal exudate.  OP clear, airway patent.   Neck:  Bilateral tender anterior cervical adenopathy.   Cardiovascular: Normal rate, regular  rhythm and normal heart sounds.  Exam reveals no gallop and no friction rub.   No murmur heard. Pulmonary/Chest: Effort normal and breath sounds normal. No respiratory distress. She has no wheezes. She has no rales. She exhibits no tenderness.  Abdominal: Soft. She exhibits no distension and no mass. There is no tenderness. There is no rebound and no guarding.  Musculoskeletal: She exhibits no edema.  Neurological: She is alert and oriented to person, place, and time.  Skin: Skin is warm and dry.  Erythematous macular rash of hairline and nape. Right ear with erythema and crusting. No drainage.   Psychiatric: She has a normal mood and affect. Her behavior is normal. Judgment and thought content normal.  Nursing note and vitals reviewed.   ED Course  Procedures (including critical care time) Labs Review Labs Reviewed - No data to display  Imaging Review No results found. I have personally reviewed and evaluated these images and lab  results as part of my medical decision-making.   EKG Interpretation None      MDM   Final diagnoses:  Contact dermatitis  Cellulitis of face   [place] M Ayotte presents with persistent facial swelling and lymphadenopathy after allergic reaction to hair dye on 1/28. She has been seen in ED on 1/28 and 1/31 for the same. Patient already on steroids and h1/h2 antihistamines. Lungs are CTA bilaterally, O2 of 97-98% on RA in ED, airway patent. Tender lymphadenopathy, but no neck/tongue/airway edema. Patient informed to continue taking ABX, prednisone, and antihistamines as directed. Educated that contact dermatitis reactions often take 1-2 weeks to subside. Stressed importance of medication compliance and follow up. Strict return precautions given, follow up instructions given. All questions answered.  [facility] Larico Dimock, PA-C 10/13/15 2128  Dione Booze, MD 10/14/15 Moses Manners

## 2015-10-13 NOTE — ED Provider Notes (Signed)
[facility] Emergency Department Provider Note ____________________________________________  Time seen: 2145  I have reviewed the triage vital signs and the nursing notes.  HISTORY  Chief Complaint  Rash and Allergic Reaction  HPI Olivia Obrien is a 25 y.o. female returns to the ED for reevaluation of facial swelling, and rash around the hairline after a contact exposure to hair dye. She was evaluated here, by me, 2 days prior. She was treated with antihistamines in the form of Periactin and ranitidine. She is also provided with a prescription for prednisone to dose daily. She was advised to use a previously prescribed steroid cream around the hairline. She returns today with increasing irritation to the ears bilaterally and increased inflammation of her lymph nodes. She also notes some crusting and weeping of the skin around the hairline and the ears. She denies any interim fevers, chills, or sweats.  Past Medical History  Diagnosis Date  . Hx of chlamydia infection   . Medical history non-contributory     Patient Active Problem List   Diagnosis Date Noted  . NSVD (normal spontaneous vaginal delivery) 08/14/2014  . Post term pregnancy 08/13/2014  . Post-term pregnancy, 40-42 weeks of gestation   . Small bowel obstruction, fetal, affecting care of mother, antepartum     Past Surgical History  Procedure Laterality Date  . Wisdom tooth extraction      Current Outpatient Rx  Name  Route  Sig  Dispense  Refill  . acetaminophen (TYLENOL) 325 MG tablet   Oral   Take 650 mg by mouth every 6 (six) hours as needed.         Marland Kitchen amLODipine (NORVASC) 2.5 MG tablet   Oral   Take 2 tablets (5 mg total) by mouth daily.   40 tablet   0      tablets are fine, Could not get Epic to pull u ...   . amoxicillin (AMOXIL) 500 MG tablet   Oral   Take 1 tablet (500 mg total) by mouth 3 (three) times daily.   30 tablet   0   . cephALEXin (KEFLEX) 500 MG  capsule   Oral   Take 1 capsule (500 mg total) by mouth 4 (four) times daily.   28 capsule   0   . clobetasol ointment (TEMOVATE) 0.05 %   Topical   Apply 1 application topically 2 (two) times daily. Apply to affected area   30 g   2   . cyproheptadine (PERIACTIN) 4 MG tablet   Oral   Take 1 tablet (4 mg total) by mouth 3 (three) times daily as needed for allergies.   30 tablet   0   . docusate sodium (COLACE) 100 MG capsule   Oral   Take 1 capsule (100 mg total) by mouth 2 (two) times daily as needed. Patient not taking: Reported on 08/24/2014   30 capsule   2   . hydrochlorothiazide (HYDRODIURIL) 25 MG tablet   Oral   Take 1 tablet (25 mg total) by mouth daily.   20 tablet   0   . HYDROcodone-acetaminophen (NORCO/VICODIN) 5-325 MG tablet   Oral   Take 1 tablet by mouth every 4 (four) hours as needed.   12 tablet   0   . hydrocortisone-pramoxine (PROCTOFOAM-HC) rectal foam   Rectal   Place 1 applicator rectally 2 (two) times daily.         Marland Kitchen ibuprofen (MOTRIN IB) 200 MG tablet   Oral  Take 3 tablets (600 mg total) by mouth every 8 (eight) hours as needed for mild pain or moderate pain.   15 tablet   0   . loratadine (CLARITIN) 10 MG tablet   Oral   Take 1 tablet (10 mg total) by mouth daily.   30 tablet   0   . mupirocin ointment (BACTROBAN) 2 %      Apply to affected area 3 times daily   22 g   0   . predniSONE (DELTASONE) 10 MG tablet   Oral   Take 1 tablet (10 mg total) by mouth 2 (two) times daily with a meal.   10 tablet   0   . Prenatal Vit-Fe Fumarate-FA (PRENATAL MULTIVITAMIN) TABS tablet   Oral   Take 1 tablet by mouth daily at 12 noon.         . ranitidine (ZANTAC) 150 MG tablet   Oral   Take 1 tablet (150 mg total) by mouth 2 (two) times daily.   20 tablet   0   . traMADol (ULTRAM) 50 MG tablet   Oral   Take 1 tablet (50 mg total) by mouth 2 (two) times daily.   10 tablet   0    Allergies Bee venom and  Dermoplast  Family History  Problem Relation Age of Onset  . Depression Mother   . Hypertension Mother   . Heart disease Father     clot in heart  . Seizures Father   . Tuberculosis Father   . Depression Father     Social History Social History  Substance Use Topics  . Smoking status: Current Some Day Smoker    Types: Cigarettes  . Smokeless tobacco: None     Comment: Occasionally 1 cigarette / week  . Alcohol Use: Yes   Review of Systems  Constitutional: Negative for fever. Eyes: Negative for visual changes. ENT: Negative for sore throat. Cardiovascular: Negative for chest pain. Respiratory: Negative for shortness of breath. Gastrointestinal: Negative for abdominal pain, vomiting and diarrhea. Genitourinary: Negative for dysuria. Musculoskeletal: Negative for back pain. Skin: Positive for rash. Neurological: Negative for headaches, focal weakness or numbness. ____________________________________________  PHYSICAL EXAM:  VITAL SIGNS: ED Triage Vitals  Enc Vitals Group     BP 10/12/15 1701 129/84 mmHg     Pulse Rate 10/12/15 1701 95     Resp 10/12/15 1701 18     Temp 10/12/15 1701 98.9 F (37.2 C)     Temp Source 10/12/15 1701 Oral     SpO2 10/12/15 1701 98 %     Weight 10/12/15 1701 118 lb (53.524 kg)     Height 10/12/15 1701 5' (1.524 m)     Head Cir --      Peak Flow --      Pain Score 10/12/15 1703 9     Pain Loc --      Pain Edu? --      Excl. in GC? --    Constitutional: Alert and oriented. Well appearing and in no distress. Head: Normocephalic and atraumatic.      Eyes: Conjunctivae are normal. PERRL. Normal extraocular movements      Ears: Canals clear. TMs intact bilaterally.   Nose: No congestion/rhinorrhea.   Mouth/Throat: Mucous membranes are moist.   Neck: Supple. No thyromegaly. Hematological/Lymphatic/Immunological: Palpable posterior cervical and preauricular lymphadenopathy bilaterally. Cardiovascular: Normal rate, regular  rhythm.  Respiratory: Normal respiratory effort. No wheezes/rales/rhonchi. Gastrointestinal: Soft and nontender. No distention. Musculoskeletal: Nontender with normal  range of motion in all extremities.  Neurologic:  Normal gait without ataxia. Normal speech and language. No gross focal neurologic deficits are appreciated. Skin:  Skin is warm, dry and intact. Patient is noted to have continued erythematous macular rash to the complete hairline and nape. She is also noted to have some edema to the pinna bilaterally. There is also some appreciable mild honey-colored crust noted to the ears. No purulent discharge is appreciated. Psychiatric: Mood and affect are normal. Patient exhibits appropriate insight and judgment. ____________________________________________  PROCEDURES   Keflex 500 mg PO Ultram 50 mg PO ____________________________________________  INITIAL IMPRESSION / ASSESSMENT AND PLAN / ED COURSE  Patient with a subsequent visit for a contact dermatitis secondary to hair dye. She is also noted some progression of her facial cellulitis to the ears. There appears to be some secondary infection with staph Aureus, so she will be discharged with a prescription for the mupirocin ointment, and Keflex to dose as directed. She is also provided with #10 Ultram to dose as needed for pain. She will discontinue the use of the steroid cream and continue to use antihistamines as previously prescribed. He is also encouraged to dose the prednisone until completed. She'll follow up with [facility] for ongoing symptom management and return to the ED for acutely worsening symptoms as discussed. ____________________________________________  FINAL CLINICAL IMPRESSION(S) / ED DIAGNOSES  Final diagnoses:  Contact dermatitis due to dye  Cellulitis of face      Lissa Hoard, PA-C 10/13/15 0028  Lissa Hoard, PA-C 10/13/15 0030  Charlesetta Ivory [place],  PA-C 10/13/15 0030  Myrna Blazer, MD 10/18/15 [phone]

## 2015-10-13 NOTE — Discharge Instructions (Signed)
Continue taking medications as discussed. Please take all of your antibiotics until finished!  Return to ER for shortness of breath, new or worsening symptoms, any additional concerns.  Follow up with your primary physician in 3 days for discussion of today's diagnosis and further evaluation and management.

## 2016-02-10 IMAGING — US US OB COMP +14 WK
1 of 2 series · 12 of 28 positions shown · non-contrast
Comparison: none

[Series 1: us ob comp +14 wk · 12 of 103 slices shown]
[im 1/103]
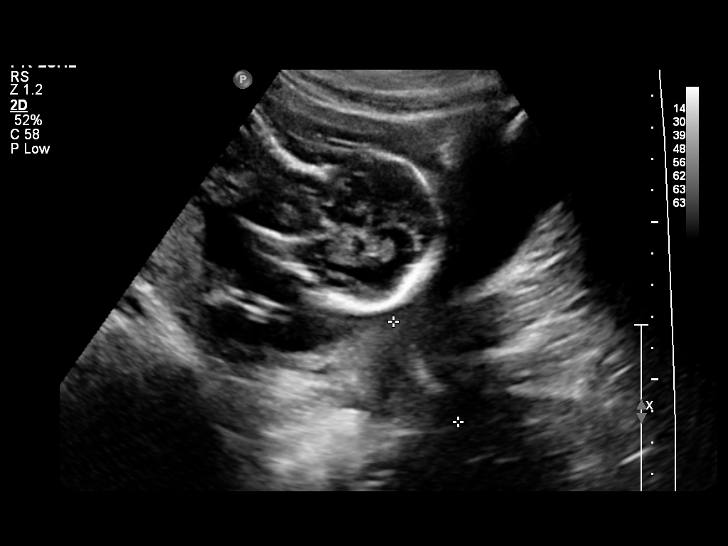
[im 8/103]
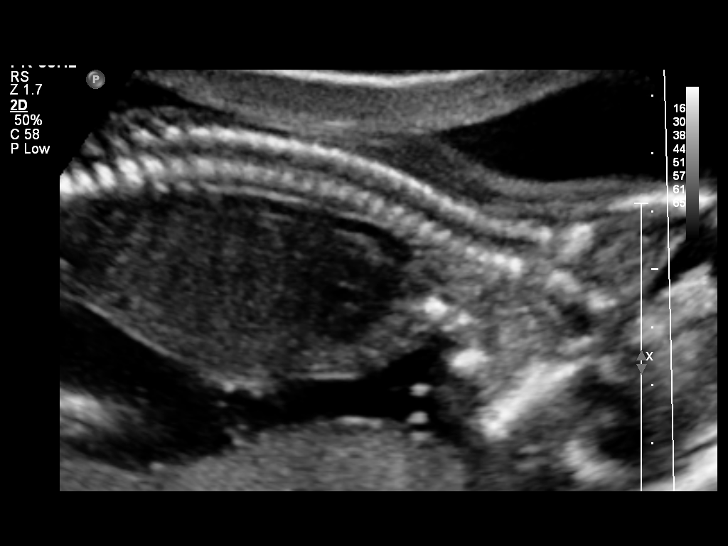
[im 16/103]
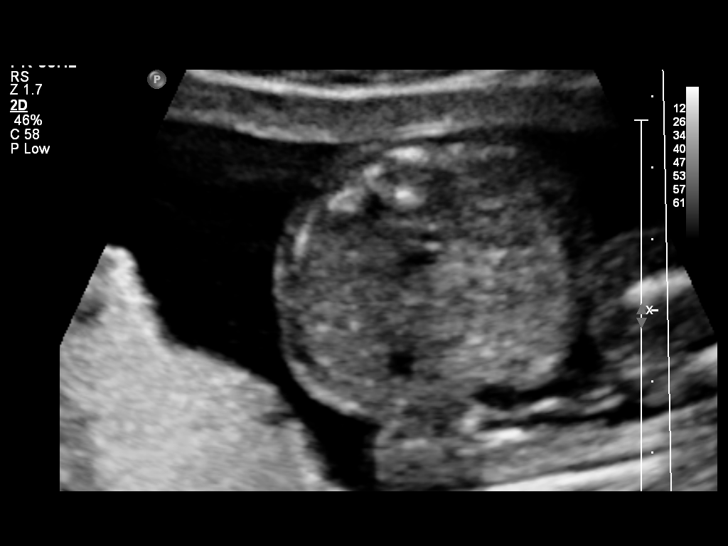
[im 28/103]
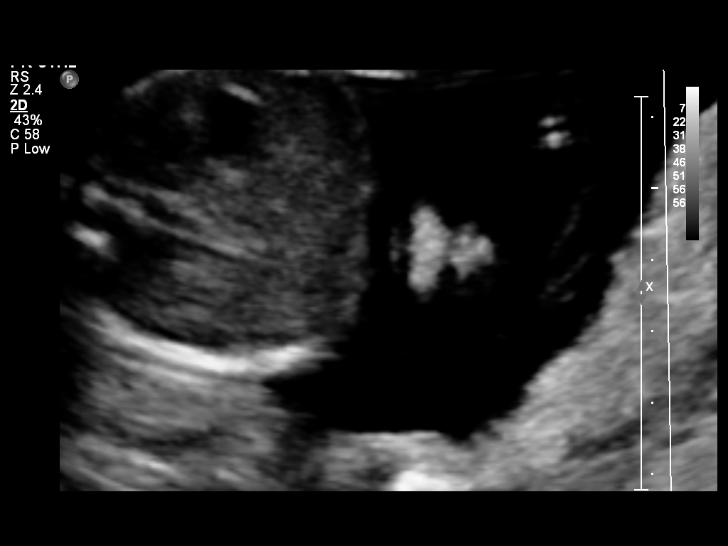
[im 36/103]
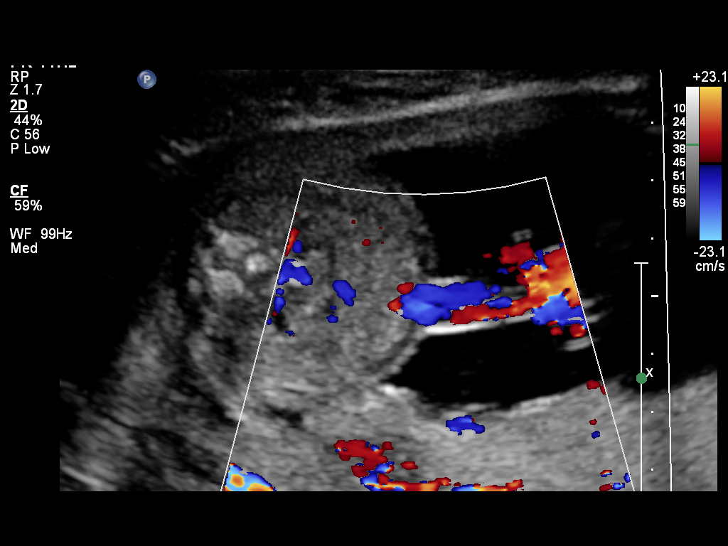
[im 44/103]
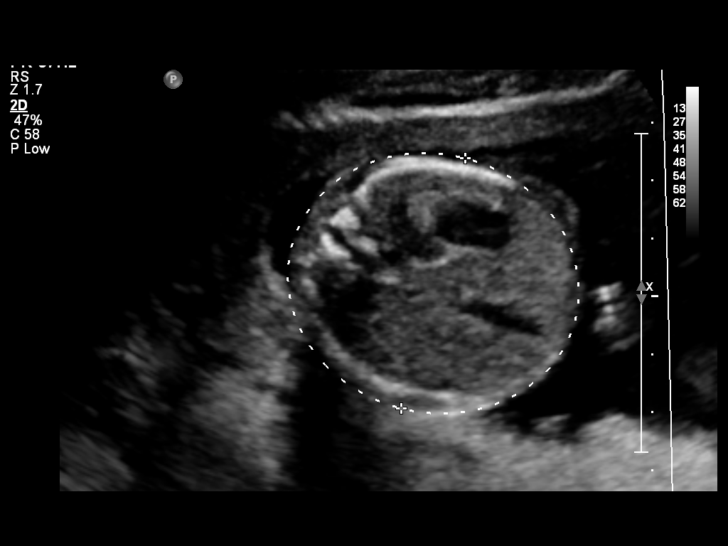
[im 55/103]
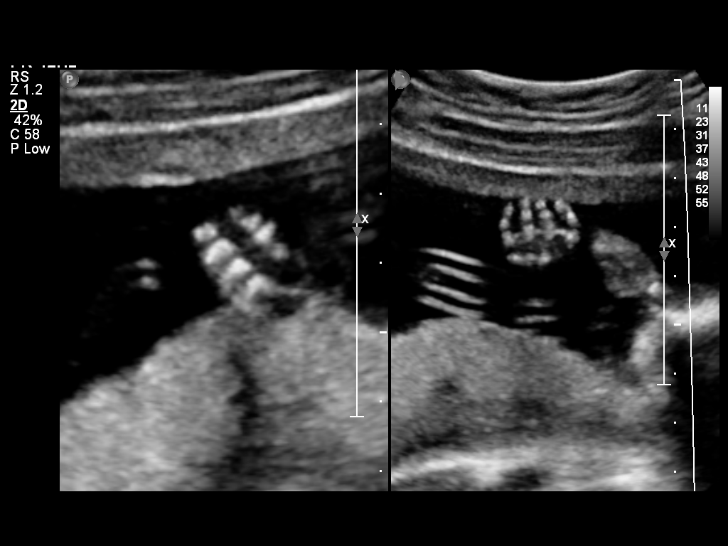
[im 63/103]
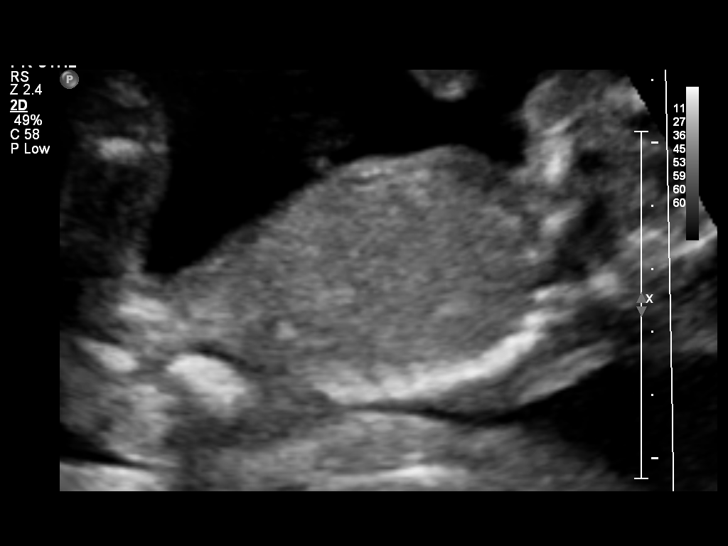
[im 71/103]
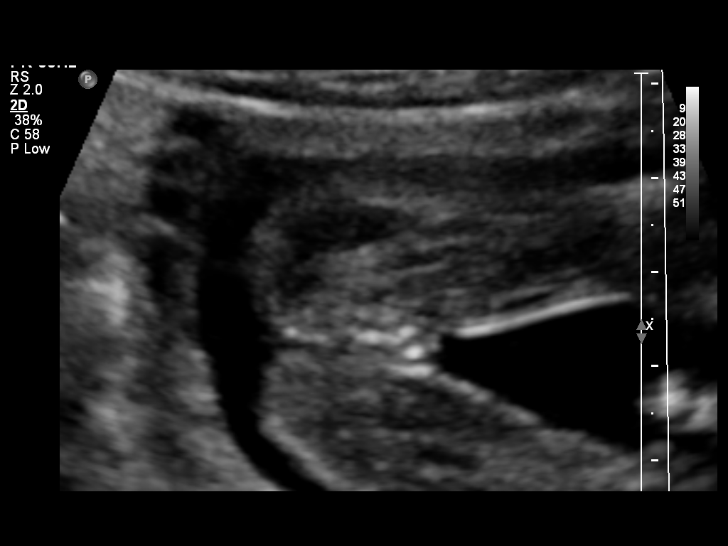
[im 83/103]
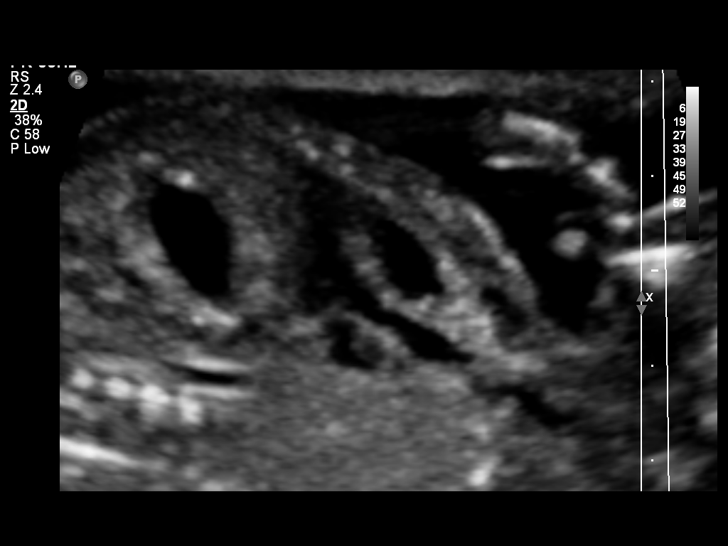
[im 91/103]
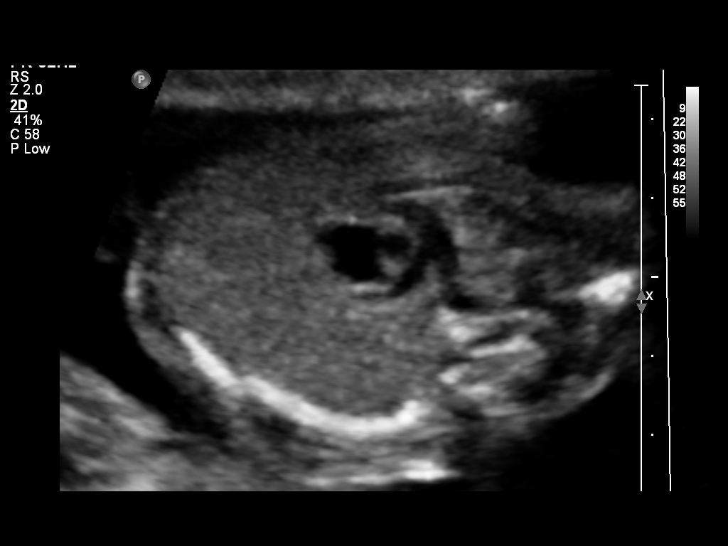
[im 99/103]
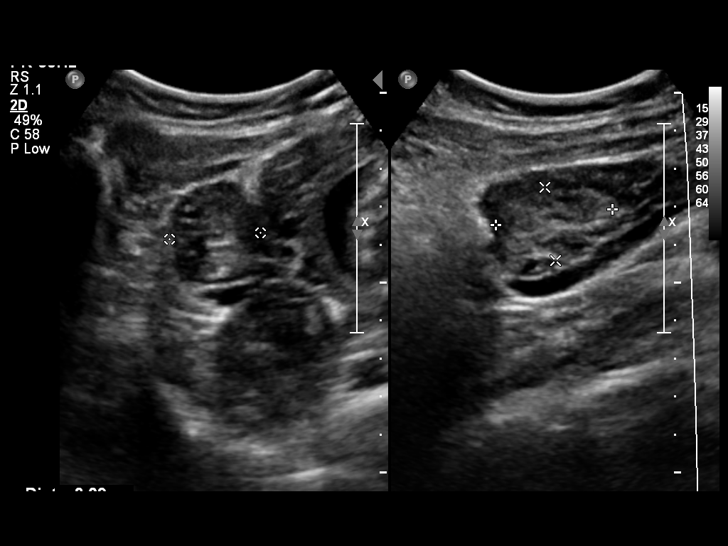

[12 of 28 positions shown; findings below may reference images not displayed]

OBSTETRICS REPORT
                      (Signed Final 03/16/2014 [DATE])

Service(s) Provided

 US OB COMP + 14 WK                                    76805.1
Indications

 Basic anatomic survey
Fetal Evaluation

 Num Of Fetuses:    1
 Fetal Heart Rate:  150                          bpm
 Cardiac Activity:  Observed
 Presentation:      Cephalic
 Placenta:          Posterior, above cervical
                    os
 P. Cord            Visualized, central
 Insertion:

 Amniotic Fluid
 AFI FV:      Subjectively within normal limits
                                             Larg Pckt:       4  cm
 LUQ:   4       cm
Biometry

 BPD:     47.3  mm     G. Age:  20w 2d                CI:        73.14   70 - 86
                                                      FL/HC:      17.3   16.1 -

 HC:     175.8  mm     G. Age:  20w 1d       73  %    HC/AC:      1.18   1.09 -

 AC:     149.1  mm     G. Age:  20w 1d       69  %    FL/BPD:
 FL:      30.4  mm     G. Age:  19w 3d       42  %    FL/AC:      20.4   20 - 24
 HUM:     29.5  mm     G. Age:  19w 5d       57  %
 CER:     20.4  mm     G. Age:  19w 3d       49  %
 NFT:     4.72  mm
 Est. FW:     317  gm    0 lb 11 oz      53  %
Gestational Age

 LMP:           19w 3d        Date:  10/31/13                 EDD:   08/07/14
 U/S Today:     20w 0d                                        EDD:   08/03/14
 Best:          19w 3d     Det. By:  LMP  (10/31/13)          EDD:   08/07/14
Anatomy
 Cranium:          Appears normal         Aortic Arch:      Appears normal
 Fetal Cavum:      Appears normal         Ductal Arch:      Appears normal
 Ventricles:       Appears normal         Diaphragm:        Appears normal
 Choroid Plexus:   Appears normal         Stomach:          Appears normal, left
                                                            sided
 Cerebellum:       Appears normal         Abdomen:          Appears normal
 Posterior Fossa:  Appears normal         Abdominal Wall:   Appears nml (cord
                                                            insert, abd wall)
 Nuchal Fold:      Appears normal         Cord Vessels:     Appears normal (3
                                                            vessel cord)
 Face:             Appears normal         Kidneys:          Appear normal
                   (orbits and profile)
 Lips:             Appears normal         Bladder:          Appears normal
 Heart:            Appears normal         Spine:            Appears normal
                   (4CH, axis, and
                   situs)
 RVOT:             Appears normal         Lower             Visualized
                                          Extremities:
 LVOT:             Appears normal         Upper             Appears normal
                                          Extremities:

 Other:  Female gender. Heels and 5th digit visualized. Nasal bone visualized.
Targeted Anatomy

 Fetal Central Nervous System
 Lat. Ventricles:  5.8                    Cisterna Magna:
Cervix Uterus Adnexa

 Cervical Length:    3.77     cm

 Cervix:       Normal appearance by transabdominal scan.
 Left Ovary:    Within normal limits.
 Right Ovary:   Within normal limits.
Comments

 Right femur appears bowed??
Impression

 Single IUP at 19w 3d
 The right femur appears to be bowed and shortened
 compared the the left femur
 The remainder of the fetal anatomy is within normal limits
 The other long bones appear normal in morphology and size
 Posterior placenta without previa
 Normal amniotic fluid volume
Recommendations

 Recommend follow-up ultrasound examination in 4 weeks for
 interval growth and to reevalaute the fetal femurs - please
 schedule with MFM ([HOSPITAL] Fetal Medicine)

 questions or concerns.

## 2016-06-02 IMAGING — US US OB FOLLOW-UP
1 series · 12 of 28 positions shown · non-contrast
Comparison: none

[Series 1: us ob follow-up · 0.25mm/px · 12 of 55 slices shown]
[im 3/55]
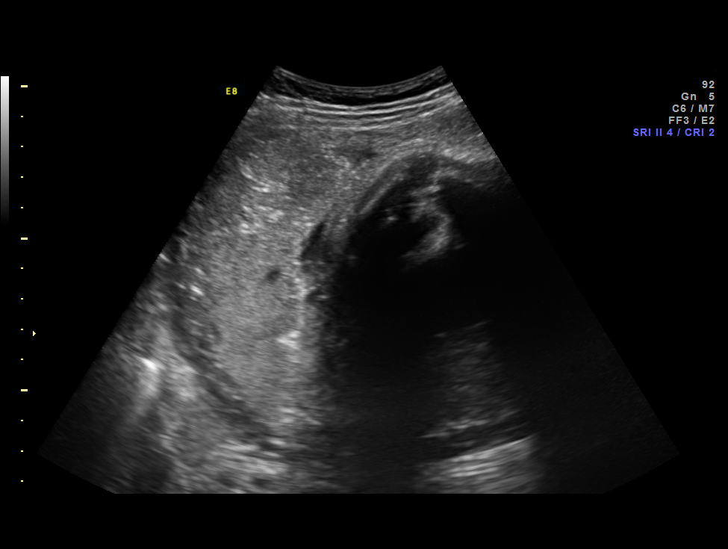
[im 7/55]
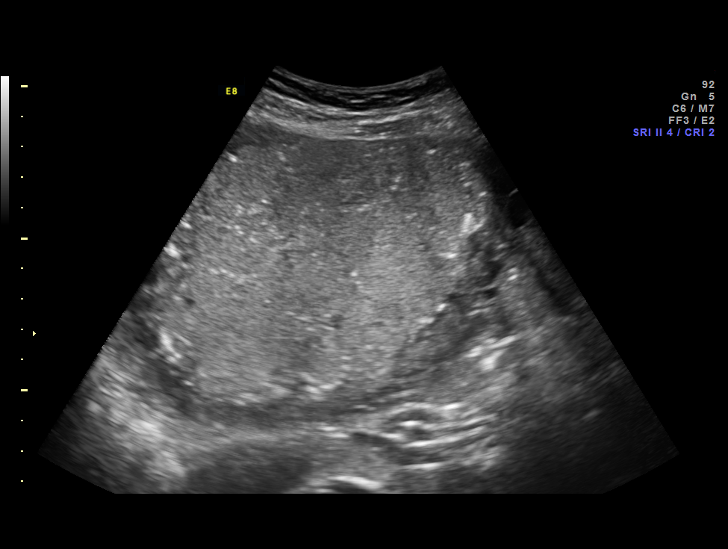
[im 11/55]
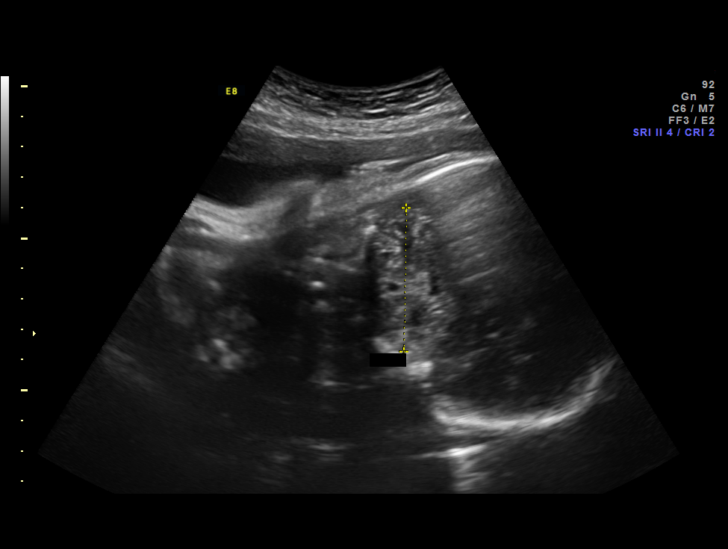
[im 17/55]
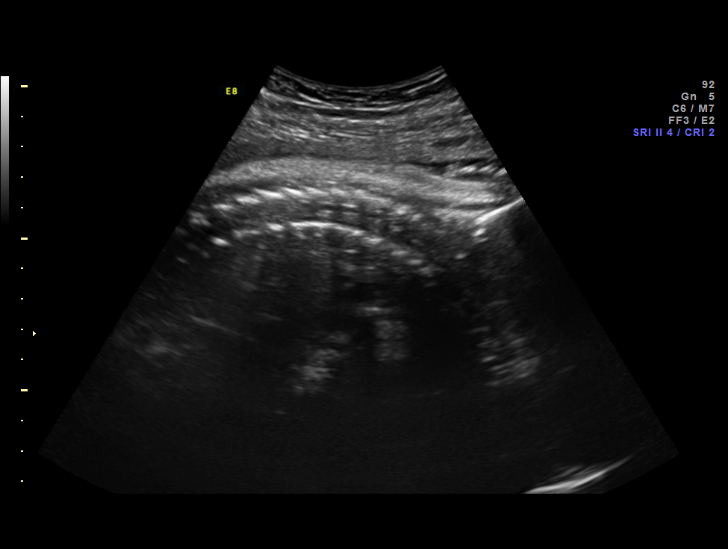
[im 21/55]
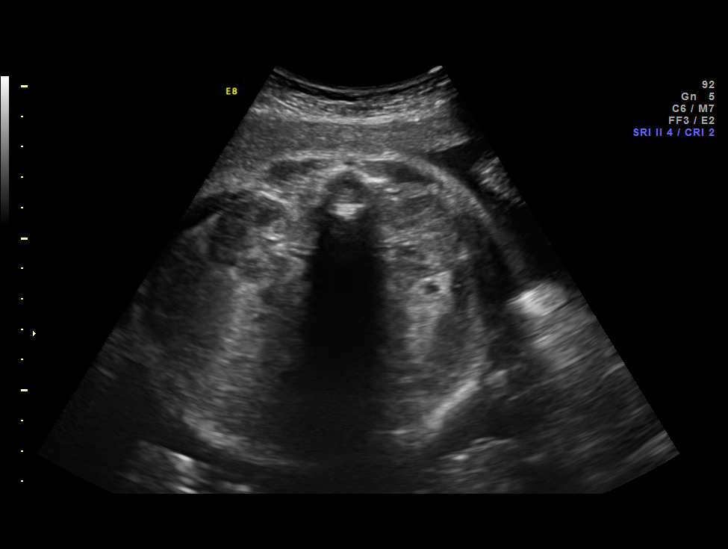
[im 25/55]
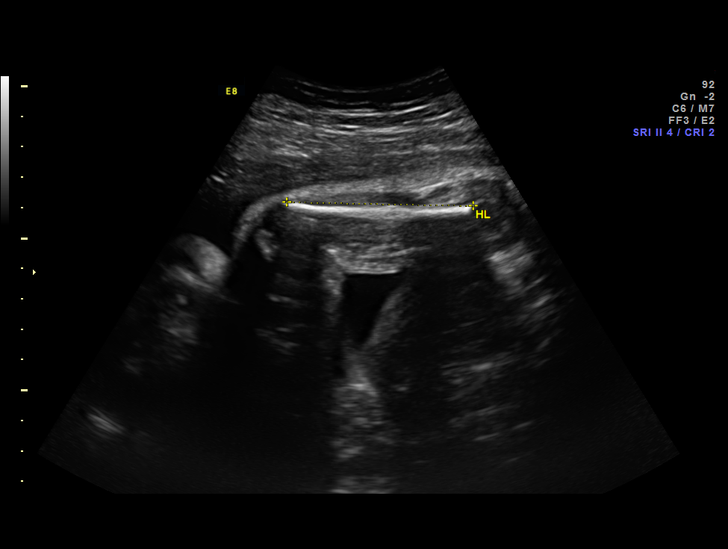
[im 31/55]
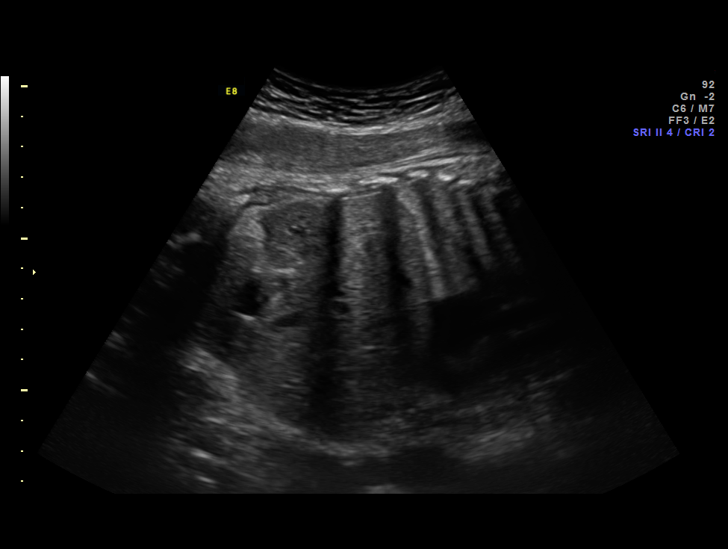
[im 35/55]
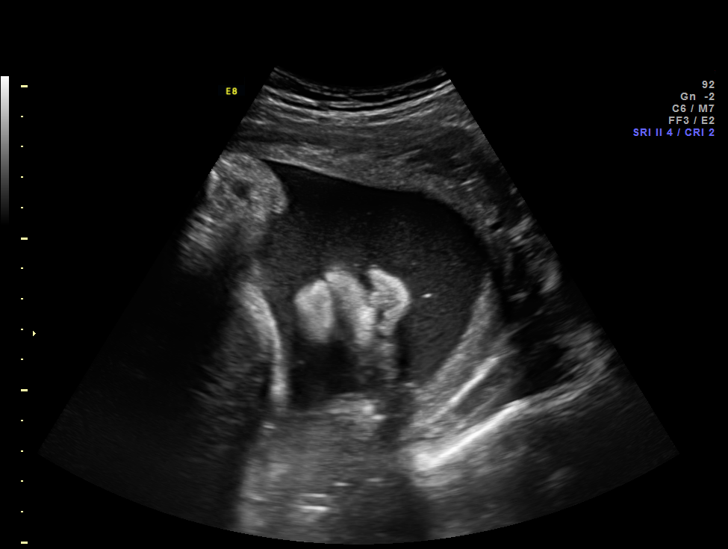
[im 39/55]
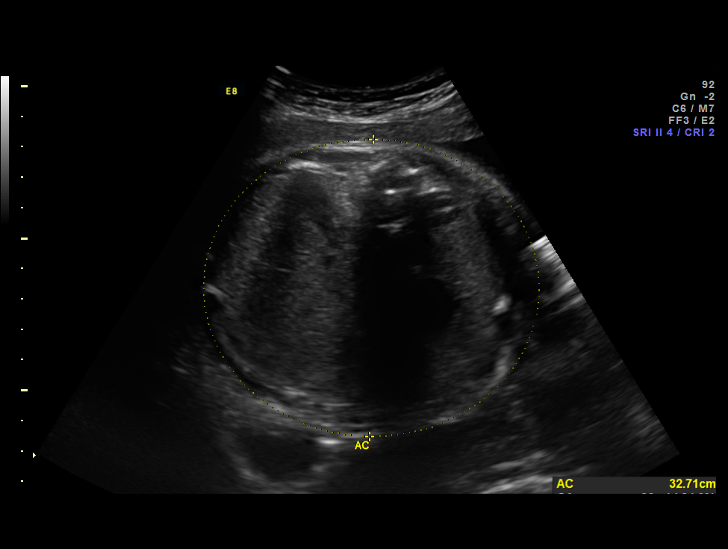
[im 45/55]
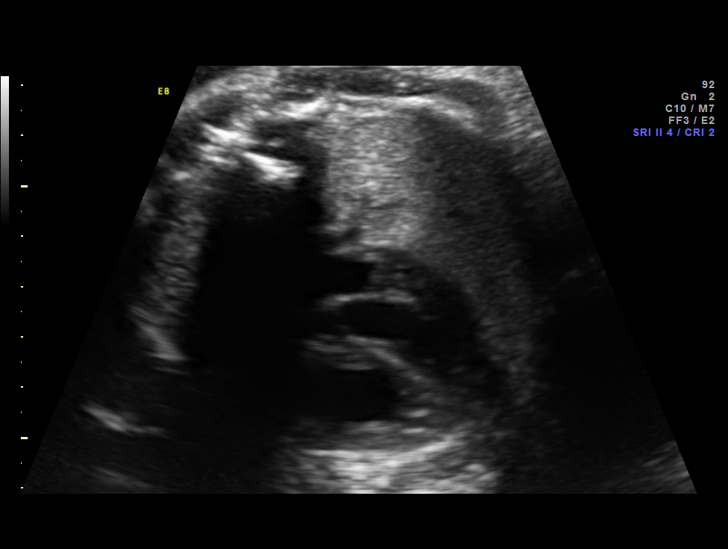
[im 49/55]
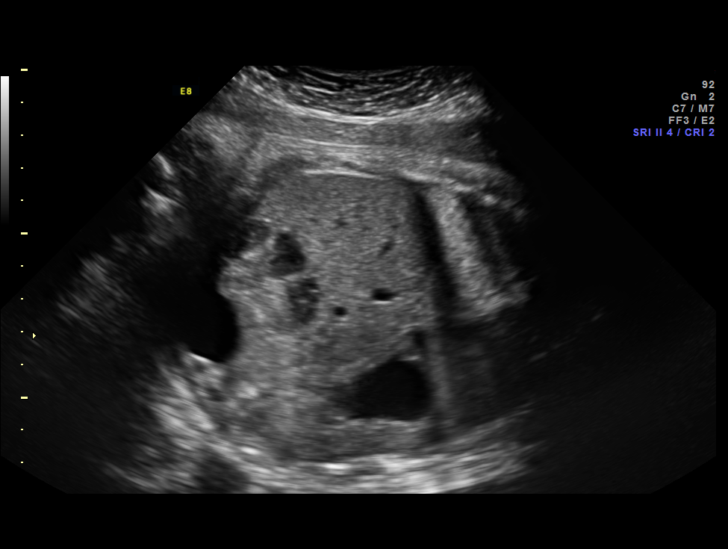
[im 53/55]
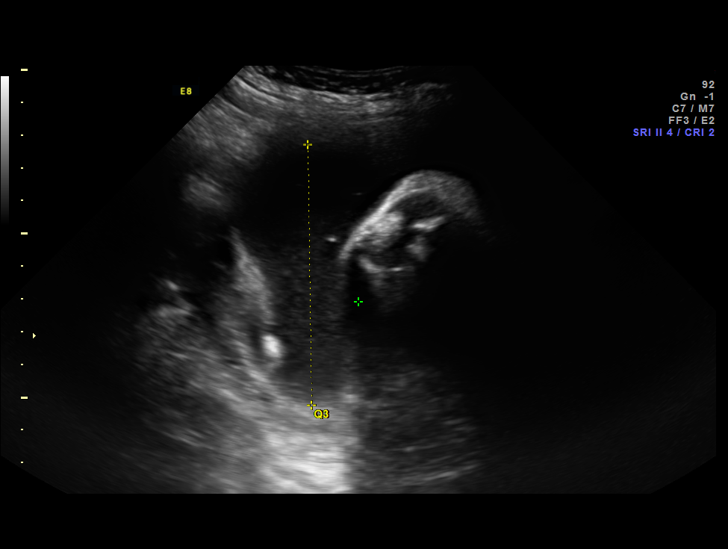

[12 of 28 positions shown; findings below may reference images not displayed]

OBSTETRICS REPORT
                      (Signed Final 07/07/2014 [DATE])

Service(s) Provided

 US OB FOLLOW UP                                       76816.1
Indications

 Abnormal radiological finding on antenatal
 screening of mother (right femur)
 35 weeks gestation of pregnancy
Fetal Evaluation

 Num Of Fetuses:    1
 Fetal Heart Rate:  126                          bpm
 Cardiac Activity:  Observed
 Presentation:      Cephalic
 Placenta:          Posterior Fundal, above
                    cervical os

 Amniotic Fluid
 AFI FV:      Subjectively within normal limits
 AFI Sum:     18.9    cm       70  %Tile
Biometry

 BPD:     87.4  mm     G. Age:  35w 2d                CI:        78.28   70 - 86
                                                      FL/HC:      21.0   20.1 -

 HC:     312.5  mm     G. Age:  35w 0d       10  %    HC/AC:      0.93   0.93 -

 AC:       335  mm     G. Age:  37w 3d       94  %    FL/BPD:     74.9   71 - 87
 FL:      65.5  mm     G. Age:  33w 5d        8  %    FL/AC:      19.6   20 - 24
 HUM:     61.5  mm     G. Age:  35w 4d       65  %
 CER:     47.3  mm     G. Age:  N/A        > 95  %

 Est. FW:    6265  gm      6 lb 4 oz     73  %
Gestational Age

 LMP:           35w 4d        Date:  10/31/13                 EDD:   08/07/14
 U/S Today:     35w 3d                                        EDD:   08/08/14
 Best:          35w 4d     Det. By:  LMP  (10/31/13)          EDD:   08/07/14
Anatomy

 Cranium:          Appears normal         Aortic Arch:      Appears normal
 Fetal Cavum:      Appears normal         Ductal Arch:      Previously seen
 Ventricles:       Appears normal         Diaphragm:        Appears normal
 Choroid Plexus:   Previously seen        Stomach:          Appears normal, left
                                                            sided
 Cerebellum:       Appears normal         Abdomen:          Appears normal
 Posterior Fossa:  Appears normal         Abdominal Wall:   Previously seen
 Nuchal Fold:      Previously seen        Cord Vessels:     Previously seen
 Face:             Orbits and profile     Kidneys:          Appear normal
                   previously seen
 Lips:             Appears normal         Bladder:          Appears normal
 Palate:           Previously seen        Spine:            Previously seen
 Heart:            Appears normal         Lower             Abnormal, see
                   (4CH, axis, and        Extremities:
                   situs)
                                                            comments

 RVOT:             Appears normal         Upper             Previously seen
                                          Extremities:
 LVOT:             Appears normal

 Other:  Female gender previously seen. Heels and 5th digit previously seen.
Cervix Uterus Adnexa

 Cervix:       Not visualized (advanced GA >18wks)
 Left Ovary:    Previously seen.
 Right Ovary:   Previously seen
Impression

 SIUP at 35+4 weeks
 Slightly curved right femur; appeared to be an isolated finding
 Normal interval anatomy; anatomic survey complete
 Normal amniotic fluid volume
 Appropriate interval growth with EFW at the 73rd %tile
Recommendations

 Follow-up ultrasounds as clinically indicated.

## 2016-09-23 ENCOUNTER — Ambulatory Visit (HOSPITAL_COMMUNITY)
Admission: EM | Admit: 2016-09-23 | Discharge: 2016-09-23 | Disposition: A | Discharge status: Home / Self Care | Payer: BLUE CROSS/BLUE SHIELD | Attending: Internal Medicine | Admitting: Internal Medicine

## 2016-09-23 ENCOUNTER — Encounter (HOSPITAL_COMMUNITY): Payer: Self-pay | Admitting: Emergency Medicine

## 2016-09-23 DIAGNOSIS — R05 Cough: Secondary | ICD-10-CM

## 2016-09-23 DIAGNOSIS — J0140 Acute pansinusitis, unspecified: Secondary | ICD-10-CM

## 2016-09-23 DIAGNOSIS — R059 Cough, unspecified: Secondary | ICD-10-CM

## 2016-09-23 MED ORDER — BENZONATATE 100 MG PO CAPS: 100.0000 mg | ORAL_CAPSULE | Freq: Three times a day (TID) | ORAL | 0 refills | Status: DC

## 2016-09-23 MED ORDER — AMOXICILLIN-POT CLAVULANATE 875-125 MG PO TABS: 1.0000 | ORAL_TABLET | Freq: Two times a day (BID) | ORAL | 0 refills | Status: AC

## 2016-09-23 NOTE — ED Provider Notes (Signed)
CSN: [id number]     Arrival date & time 09/23/16  1449 History   None    Chief Complaint  Patient presents with  . Sore Throat   (Consider location/radiation/quality/duration/timing/severity/associated sxs/prior Treatment) 26 year old female presents to clinic with 2 week history of cough, sinus pain, and sinus pressure with dental pain, green sputum, and malodorous breath. She also complains of cough, and reports she started having fever last night of 101 at home. She has no nausea, vomiting, diarrhea, does have sore throat, no body aches, no chills, no change in appetite      Sore Throat     Past Medical History:  Diagnosis Date  . Hx of chlamydia infection   . Medical history non-contributory    Past Surgical History:  Procedure Laterality Date  . WISDOM TOOTH EXTRACTION     Family History  Problem Relation Age of Onset  . Depression Mother   . Hypertension Mother   . Heart disease Father     clot in heart  . Seizures Father   . Tuberculosis Father   . Depression Father    Social History  Substance Use Topics  . Smoking status: Current Some Day Smoker    Types: Cigarettes  . Smokeless tobacco: Not on file     Comment: Occasionally 1 cigarette / week  . Alcohol use Yes   OB History    Gravida Para Term Preterm AB Living   1 1 1     1    SAB TAB Ectopic Multiple Live Births         0 1     Review of Systems  Reason unable to perform ROS: as covered in HPI.  All other systems reviewed and are negative.   Allergies  Mupirocin; Other; Bee venom; and Dermoplast [benzocaine]  Home Medications   Prior to Admission medications   Medication Sig Start Date End Date Taking? Authorizing Provider  amLODipine (NORVASC) 2.5 MG tablet Take 2 tablets (5 mg total) by mouth daily. Patient not taking: Reported on 09/23/2016 08/20/14   Aviva Signs, CNM  amoxicillin (AMOXIL) 500 MG tablet Take 1 tablet (500 mg total) by mouth 3 (three) times daily. Patient not taking:  Reported on 09/23/2016 09/06/15   Chinita Pester, FNP  amoxicillin-clavulanate (AUGMENTIN) 875-125 MG tablet Take 1 tablet by mouth 2 (two) times daily. 09/23/16 09/30/16  Dorena Bodo, NP  benzonatate (TESSALON) 100 MG capsule Take 1 capsule (100 mg total) by mouth every 8 (eight) hours. 09/23/16   Dorena Bodo, NP  cephALEXin (KEFLEX) 500 MG capsule Take 1 capsule (500 mg total) by mouth 4 (four) times daily. Patient taking differently: Take 500 mg by mouth 4 (four) times daily. Started 10/12/15, for 7 days ending 10/17/15 10/12/15   Charlesetta Ivory Menshew, PA-C  clobetasol ointment (TEMOVATE) 0.05 % Apply 1 application topically 2 (two) times daily. Apply to affected area Patient taking differently: Apply 1 application topically daily as needed (for irritation). Apply to affected area 08/20/14   Aviva Signs, CNM  cyproheptadine (PERIACTIN) 4 MG tablet Take 1 tablet (4 mg total) by mouth 3 (three) times daily as needed for allergies. Patient not taking: Reported on 09/23/2016 10/09/15   Charlesetta Ivory Menshew, PA-C  docusate sodium (COLACE) 100 MG capsule Take 1 capsule (100 mg total) by mouth 2 (two) times daily as needed. Patient not taking: Reported on 08/24/2014 08/17/14   Misty Stanley A Leftwich-Kirby, CNM  hydrochlorothiazide (HYDRODIURIL) 25 MG tablet Take 1  tablet (25 mg total) by mouth daily. Patient not taking: Reported on 09/23/2016 08/20/14   Aviva Signs, CNM  HYDROcodone-acetaminophen (NORCO/VICODIN) 5-325 MG tablet Take 1 tablet by mouth every 4 (four) hours as needed. 09/06/15   Chinita Pester, FNP  ibuprofen (MOTRIN IB) 200 MG tablet Take 3 tablets (600 mg total) by mouth every 8 (eight) hours as needed for mild pain or moderate pain. 02/15/15   Renford Dills, NP  loratadine (CLARITIN) 10 MG tablet Take 1 tablet (10 mg total) by mouth daily. Patient not taking: Reported on 09/23/2016 08/20/14   Aviva Signs, CNM  mupirocin ointment Idelle Jo) 2 % Apply to affected area 3 times  daily Patient not taking: Reported on 09/23/2016 10/12/15   Charlesetta Ivory Menshew, PA-C  neomycin-bacitracin-polymyxin (NEOSPORIN) OINT Apply 1 application topically daily as needed for irritation or wound care.    Historical Provider, MD  predniSONE (DELTASONE) 10 MG tablet Take 1 tablet (10 mg total) by mouth 2 (two) times daily with a meal. 10/09/15   Jenise V Bacon Menshew, PA-C  ranitidine (ZANTAC) 150 MG tablet Take 1 tablet (150 mg total) by mouth 2 (two) times daily. Patient taking differently: Take 150 mg by mouth daily as needed for heartburn.  10/09/15   Jenise V Bacon Menshew, PA-C  traMADol (ULTRAM) 50 MG tablet Take 1 tablet (50 mg total) by mouth 2 (two) times daily. Patient taking differently: Take 50 mg by mouth every 12 (twelve) hours as needed for severe pain.  10/12/15   Charlesetta Ivory Menshew, PA-C   Meds Ordered and Administered this Visit  Medications - No data to display  BP 126/82 (BP Location: Right Arm)   Pulse 71   Temp 98.8 F (37.1 C) (Oral)   Resp 18   LMP 09/16/2016   SpO2 100%  No data found.   Physical Exam  Constitutional: She is oriented to person, place, and time. She appears well-developed. No distress.  HENT:  Head: Normocephalic.  Right Ear: Tympanic membrane and external ear normal.  Left Ear: Tympanic membrane and external ear normal.  Nose: Mucosal edema and rhinorrhea present. Right sinus exhibits maxillary sinus tenderness. Right sinus exhibits no frontal sinus tenderness. Left sinus exhibits maxillary sinus tenderness. Left sinus exhibits no frontal sinus tenderness.  Mouth/Throat: Mucous membranes are normal. Oropharyngeal exudate present. No posterior oropharyngeal edema, posterior oropharyngeal erythema or tonsillar abscesses. Tonsils are 1+ on the right. Tonsils are 1+ on the left. No tonsillar exudate.  Eyes: Pupils are equal, round, and reactive to light.  Neck: Normal range of motion. No JVD present.  Cardiovascular: Normal rate and  regular rhythm.   Pulmonary/Chest: Effort normal and breath sounds normal.  Abdominal: Soft. Bowel sounds are normal.  Lymphadenopathy:    She has no cervical adenopathy.  Neurological: She is alert and oriented to person, place, and time.  Skin: Skin is warm and dry. Capillary refill takes less than 2 seconds. She is not diaphoretic. No pallor.  Psychiatric: She has a normal mood and affect.  Nursing note and vitals reviewed.   Urgent Care Course   Clinical Course     Procedures (including critical care time)  Labs Review Labs Reviewed - No data to display  Imaging Review No results found.   Visual Acuity Review  Right Eye Distance:   Left Eye Distance:   Bilateral Distance:    Right Eye Near:   Left Eye Near:    Bilateral Near:  MDM   1. Acute non-recurrent pansinusitis   2. Cough   You most likely sinusitis.  I have prescribed Tessalon, take 3 times a day for cough, and augmentin 1 tablet twice a day for 7 days.  I advise rest, plenty of fluids and management of symptoms with over the counter medicines. For symptoms you may take Tylenol as needed every 4-6 hours for body aches or fever, not to exceed 4,000 mg a day, Take mucinex or mucinex DM ever 12 hours with a full glass of water, you may use an inhaled steroid such as Flonase, 2 sprays each nostril once a day for congestion, or an antihistamine such as Claritin or Zyrtec once a day. Should your symptoms worsen or fail to resolve, follow up with your primary care provider or return to clinic.     Dorena BodoLawrence Jefferie Holston, NP 09/23/16 [phone]

## 2016-09-23 NOTE — ED Triage Notes (Signed)
101 temp last night per patient.  Cough and sore throat for 2 weeks

## 2016-09-23 NOTE — Discharge Instructions (Signed)
You most likely sinusitis.  I have prescribed Tessalon, take 3 times a day for cough, and augmentin 1 tablet twice a day for 7 days.  I advise rest, plenty of fluids and management of symptoms with over the counter medicines. For symptoms you may take Tylenol as needed every 4-6 hours for body aches or fever, not to exceed 4,000 mg a day, Take mucinex or mucinex DM ever 12 hours with a full glass of water, you may use an inhaled steroid such as Flonase, 2 sprays each nostril once a day for congestion, or an antihistamine such as Claritin or Zyrtec once a day. Should your symptoms worsen or fail to resolve, follow up with your primary care provider or return to clinic.

## 2017-09-11 NOTE — L&D Delivery Note (Signed)
Delivery Note  SVD viable female Apgars 9,9 over intact perineum.  Placenta delivered spontaneously intact with 3VC. Good support and hemostasis noted.  R/V exam confirms.  PH art was sent.   Mother and baby to couplet care and are doing well.  EBL 100cc  Kazuo Durnil, MD 

## 2017-10-11 ENCOUNTER — Ambulatory Visit (INDEPENDENT_AMBULATORY_CARE_PROVIDER_SITE_OTHER): Payer: 59 | Admitting: Obstetrics and Gynecology

## 2017-10-11 ENCOUNTER — Encounter: Payer: Self-pay | Admitting: Obstetrics and Gynecology

## 2017-10-11 VITALS — BP 120/80 | Wt 125.0 lb

## 2017-10-11 DIAGNOSIS — Z3480 Encounter for supervision of other normal pregnancy, unspecified trimester: Secondary | ICD-10-CM | POA: Diagnosis not present

## 2017-10-11 DIAGNOSIS — Z23 Encounter for immunization: Secondary | ICD-10-CM

## 2017-10-11 DIAGNOSIS — Z3A01 Less than 8 weeks gestation of pregnancy: Secondary | ICD-10-CM

## 2017-10-11 DIAGNOSIS — Z3201 Encounter for pregnancy test, result positive: Secondary | ICD-10-CM | POA: Diagnosis not present

## 2017-10-11 DIAGNOSIS — O219 Vomiting of pregnancy, unspecified: Secondary | ICD-10-CM

## 2017-10-11 DIAGNOSIS — Z124 Encounter for screening for malignant neoplasm of cervix: Secondary | ICD-10-CM

## 2017-10-11 LAB — POCT URINE PREGNANCY: Preg Test, Ur: POSITIVE — AB

## 2017-10-11 MED ORDER — DOXYLAMINE SUCCINATE (SLEEP) 25 MG PO TABS: 25.0000 mg | ORAL_TABLET | Freq: Four times a day (QID) | ORAL | 3 refills | Status: DC | PRN

## 2017-10-11 MED ORDER — PYRIDOXINE HCL 25 MG PO TABS: 25.0000 mg | ORAL_TABLET | Freq: Every day | ORAL | 3 refills | Status: DC

## 2017-10-11 MED ORDER — PROVIDA OB 20-20-1.25 MG PO CAPS: 1.0000 | ORAL_CAPSULE | Freq: Every day | ORAL | 11 refills | Status: AC

## 2017-10-11 NOTE — Patient Instructions (Signed)
First Trimester of Pregnancy The first trimester of pregnancy is from week 1 until the end of week 13 (months 1 through 3). During this time, your baby will begin to develop inside you. At 6-8 weeks, the eyes and face are formed, and the heartbeat can be seen on ultrasound. At the end of 12 weeks, all the baby's organs are formed. Prenatal care is all the medical care you receive before the birth of your baby. Make sure you get good prenatal care and follow all of your doctor's instructions. Follow these instructions at home: Medicines  Take over-the-counter and prescription medicines only as told by your doctor. Some medicines are safe and some medicines are not safe during pregnancy.  Take a prenatal vitamin that contains at least 600 micrograms (mcg) of folic acid.  If you have trouble pooping (constipation), take medicine that will make your stool soft (stool softener) if your doctor approves. Eating and drinking  Eat regular, healthy meals.  Your doctor will tell you the amount of weight gain that is right for you.  Avoid raw meat and uncooked cheese.  If you feel sick to your stomach (nauseous) or throw up (vomit): ? Eat 4 or 5 small meals a day instead of 3 large meals. ? Try eating a few soda crackers. ? Drink liquids between meals instead of during meals.  To prevent constipation: ? Eat foods that are high in fiber, like fresh fruits and vegetables, whole grains, and beans. ? Drink enough fluids to keep your pee (urine) clear or pale yellow. Activity  Exercise only as told by your doctor. Stop exercising if you have cramps or pain in your lower belly (abdomen) or low back.  Do not exercise if it is too hot, too humid, or if you are in a place of great height (high altitude).  Try to avoid standing for long periods of time. Move your legs often if you must stand in one place for a long time.  Avoid heavy lifting.  Wear low-heeled shoes. Sit and stand up straight.  You  can have sex unless your doctor tells you not to. Relieving pain and discomfort  Wear a good support bra if your breasts are sore.  Take warm water baths (sitz baths) to soothe pain or discomfort caused by hemorrhoids. Use hemorrhoid cream if your doctor says it is okay.  Rest with your legs raised if you have leg cramps or low back pain.  If you have puffy, bulging veins (varicose veins) in your legs: ? Wear support hose or compression stockings as told by your doctor. ? Raise (elevate) your feet for 15 minutes, 3-4 times a day. ? Limit salt in your food. Prenatal care  Schedule your prenatal visits by the twelfth week of pregnancy.  Write down your questions. Take them to your prenatal visits.  Keep all your prenatal visits as told by your doctor. This is important. Safety  Wear your seat belt at all times when driving.  Make a list of emergency phone numbers. The list should include numbers for family, friends, the hospital, and police and fire departments. General instructions  Ask your doctor for a referral to a local prenatal class. Begin classes no later than at the start of month 6 of your pregnancy.  Ask for help if you need counseling or if you need help with nutrition. Your doctor can give you advice or tell you where to go for help.  Do not use hot tubs, steam rooms, or   saunas.  Do not douche or use tampons or scented sanitary pads.  Do not cross your legs for long periods of time.  Avoid all herbs and alcohol. Avoid drugs that are not approved by your doctor.  Do not use any tobacco products, including cigarettes, chewing tobacco, and electronic cigarettes. If you need help quitting, ask your doctor. You may get counseling or other support to help you quit.  Avoid cat litter boxes and soil used by cats. These carry germs that can cause birth defects in the baby and can cause a loss of your baby (miscarriage) or stillbirth.  Visit your dentist. At home, brush  your teeth with a soft toothbrush. Be gentle when you floss. Contact a doctor if:  You are dizzy.  You have mild cramps or pressure in your lower belly.  You have a nagging pain in your belly area.  You continue to feel sick to your stomach, you throw up, or you have watery poop (diarrhea).  You have a bad smelling fluid coming from your vagina.  You have pain when you pee (urinate).  You have increased puffiness (swelling) in your face, hands, legs, or ankles. Get help right away if:  You have a fever.  You are leaking fluid from your vagina.  You have spotting or bleeding from your vagina.  You have very bad belly cramping or pain.  You gain or lose weight rapidly.  You throw up blood. It may look like coffee grounds.  You are around people who have German measles, fifth disease, or chickenpox.  You have a very bad headache.  You have shortness of breath.  You have any kind of trauma, such as from a fall or a car accident. Summary  The first trimester of pregnancy is from week 1 until the end of week 13 (months 1 through 3).  To take care of yourself and your unborn baby, you will need to eat healthy meals, take medicines only if your doctor tells you to do so, and do activities that are safe for you and your baby.  Keep all follow-up visits as told by your doctor. This is important as your doctor will have to ensure that your baby is healthy and growing well. This information is not intended to replace advice given to you by your health care provider. Make sure you discuss any questions you have with your health care provider. Document Released: 02/14/2008 Document Revised: 09/05/2016 Document Reviewed: 09/05/2016 Elsevier Interactive Patient Education  2017 [employer]  
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
## 2017-10-12 LAB — RPR+RH+ABO+RUB AB+AB SCR+CB...
ANTIBODY SCREEN: NEGATIVE
HEMATOCRIT: 43.7 % (ref 34.0–46.6)
HIV SCREEN 4TH GENERATION: NONREACTIVE
Hemoglobin: 14.3 g/dL (ref 11.1–15.9)
Hepatitis B Surface Ag: NEGATIVE
MCH: 31.2 pg (ref 26.6–33.0)
MCHC: 32.7 g/dL (ref 31.5–35.7)
MCV: 95 fL (ref 79–97)
PLATELETS: 241 10*3/uL (ref 150–379)
RBC: 4.59 x10E6/uL (ref 3.77–5.28)
RDW: 13.3 % (ref 12.3–15.4)
RH TYPE: POSITIVE
RPR Ser Ql: NONREACTIVE
RUBELLA: 1.27 {index} (ref >0.99)
Varicella zoster IgG: 1677 index (ref >165)
WBC: 10.1 10*3/uL (ref 3.4–10.8)

## 2017-10-15 NOTE — Progress Notes (Signed)
Called, left message to check [facility].

## 2017-10-16 LAB — URINE DRUG PANEL 7
Amphetamines, Urine: NEGATIVE ng/mL
BARBITURATE QUANT UR: NEGATIVE ng/mL
BENZODIAZEPINE QUANT UR: NEGATIVE ng/mL
COCAINE (METAB.): NEGATIVE ng/mL
Cannabinoid Quant, Ur: POSITIVE — AB
OPIATE QUANT UR: NEGATIVE ng/mL
PCP Quant, Ur: NEGATIVE ng/mL

## 2017-10-16 LAB — PAPIG, CTNGTV, HPV, RFX 16/18
Chlamydia, Nuc. Acid Amp: NEGATIVE
GONOCOCCUS, NUC. ACID AMP: NEGATIVE
HPV, HIGH-RISK: NEGATIVE
PAP SMEAR COMMENT: 0
Trich vag by NAA: NEGATIVE

## 2017-10-16 LAB — URINE CULTURE: Organism ID, Bacteria: NO GROWTH

## 2017-10-16 NOTE — Progress Notes (Signed)
Called and discussed with patient

## 2017-11-01 ENCOUNTER — Encounter: Payer: 59 | Admitting: Obstetrics and Gynecology

## 2017-11-01 ENCOUNTER — Other Ambulatory Visit: Payer: 59

## 2017-11-07 ENCOUNTER — Other Ambulatory Visit: Payer: 59

## 2017-11-07 ENCOUNTER — Encounter: Payer: 59 | Admitting: Obstetrics & Gynecology

## 2018-04-28 ENCOUNTER — Encounter (HOSPITAL_COMMUNITY): Payer: Self-pay

## 2018-04-28 ENCOUNTER — Emergency Department (HOSPITAL_COMMUNITY)
Admission: EM | Admit: 2018-04-28 | Discharge: 2018-04-28 | Disposition: A | Discharge status: Home / Self Care | Payer: 59 | Attending: Emergency Medicine | Admitting: Emergency Medicine

## 2018-04-28 DIAGNOSIS — O99333 Smoking (tobacco) complicating pregnancy, third trimester: Secondary | ICD-10-CM | POA: Insufficient documentation

## 2018-04-28 DIAGNOSIS — Z3490 Encounter for supervision of normal pregnancy, unspecified, unspecified trimester: Secondary | ICD-10-CM

## 2018-04-28 DIAGNOSIS — Z3A35 35 weeks gestation of pregnancy: Secondary | ICD-10-CM | POA: Insufficient documentation

## 2018-04-28 DIAGNOSIS — O2343 Unspecified infection of urinary tract in pregnancy, third trimester: Secondary | ICD-10-CM | POA: Diagnosis not present

## 2018-04-28 DIAGNOSIS — O9989 Other specified diseases and conditions complicating pregnancy, childbirth and the puerperium: Secondary | ICD-10-CM | POA: Diagnosis present

## 2018-04-28 DIAGNOSIS — Z79899 Other long term (current) drug therapy: Secondary | ICD-10-CM | POA: Insufficient documentation

## 2018-04-28 DIAGNOSIS — R202 Paresthesia of skin: Secondary | ICD-10-CM

## 2018-04-28 DIAGNOSIS — F17210 Nicotine dependence, cigarettes, uncomplicated: Secondary | ICD-10-CM | POA: Diagnosis not present

## 2018-04-28 DIAGNOSIS — R200 Anesthesia of skin: Secondary | ICD-10-CM

## 2018-04-28 DIAGNOSIS — N390 Urinary tract infection, site not specified: Secondary | ICD-10-CM

## 2018-04-28 LAB — CBC WITH DIFFERENTIAL/PLATELET
Abs Immature Granulocytes: 0.1 10*3/uL (ref 0.0–0.1)
BASOS PCT: 0 %
Basophils Absolute: 0 10*3/uL (ref 0.0–0.1)
EOS ABS: 0 10*3/uL (ref 0.0–0.7)
Eosinophils Relative: 0 %
HCT: 36.8 % (ref 36.0–46.0)
Hemoglobin: 11.8 g/dL — ABNORMAL LOW (ref 12.0–15.0)
IMMATURE GRANULOCYTES: 1 %
Lymphocytes Relative: 16 %
Lymphs Abs: 1.7 10*3/uL (ref 0.7–4.0)
MCH: 30.7 pg (ref 26.0–34.0)
MCHC: 32.1 g/dL (ref 30.0–36.0)
MCV: 95.8 fL (ref 78.0–100.0)
Monocytes Absolute: 0.9 10*3/uL (ref 0.1–1.0)
Monocytes Relative: 8 %
NEUTROS PCT: 75 %
Neutro Abs: 8.1 10*3/uL — ABNORMAL HIGH (ref 1.7–7.7)
PLATELETS: 165 10*3/uL (ref 150–400)
RBC: 3.84 MIL/uL — AB (ref 3.87–5.11)
RDW: 12.1 % (ref 11.5–15.5)
WBC: 10.8 10*3/uL — AB (ref 4.0–10.5)

## 2018-04-28 LAB — URINALYSIS, ROUTINE W REFLEX MICROSCOPIC
Bilirubin Urine: NEGATIVE
GLUCOSE, UA: NEGATIVE mg/dL
HGB URINE DIPSTICK: NEGATIVE
Ketones, ur: 20 mg/dL — AB
NITRITE: NEGATIVE
Protein, ur: NEGATIVE mg/dL
SPECIFIC GRAVITY, URINE: 1.005 (ref 1.005–1.030)
pH: 7 (ref 5.0–8.0)

## 2018-04-28 LAB — BASIC METABOLIC PANEL
Anion gap: 10 (ref 5–15)
BUN: <5 mg/dL — ABNORMAL LOW (ref 6–20)
CALCIUM: 9.1 mg/dL (ref 8.9–10.3)
CO2: 21 mmol/L — ABNORMAL LOW (ref 22–32)
Chloride: 105 mmol/L (ref 98–111)
Creatinine, Ser: 0.51 mg/dL (ref 0.44–1.00)
Glucose, Bld: 78 mg/dL (ref 70–99)
Potassium: 3.7 mmol/L (ref 3.5–5.1)
SODIUM: 136 mmol/L (ref 135–145)

## 2018-04-28 MED ORDER — CEPHALEXIN 500 MG PO CAPS: 500.0000 mg | ORAL_CAPSULE | Freq: Two times a day (BID) | ORAL | 0 refills | Status: AC

## 2018-04-28 NOTE — Discharge Instructions (Addendum)
Please follow-up with your OB/GYN as soon as possible regarding your visit today. Please return to the emergency department for any new or worsening symptoms or if your symptoms do not improve. Please be sure to follow all of the recommendations provided by your OB/GYN including wearing your compression stockings. Your urinalysis today shows possible infection, please take all of your antibiotic Keflex as prescribed.  Please follow-up with your primary care doctor and your OB/GYN regarding your visit today.  Your urine has been sent for culture, you will be contacted by [facility] if it requires a different antibiotics for treatment.  Contact a doctor if: You keep on having episodes of paresthesia. Your burning or prickling feeling gets worse when you walk. You have pain or cramps. You feel dizzy. You have a rash. Get help right away if: You feel weak. You have trouble walking or moving. You have problems speaking, understanding, or seeing. You feel confused. You cannot control when you pee (urinate) or poop (bowel movement). You lose feeling (numbness) after an injury. You pass out (faint).

## 2018-04-28 NOTE — ED Provider Notes (Signed)
[facility] EMERGENCY DEPARTMENT Provider Note   CSN: [id number] Arrival date & time: 04/28/18  1356     History   Chief Complaint No chief complaint on file.   HPI Olivia Obrien is a 27 y.o. female [redacted] weeks pregnant presenting for lightheadeness as well as left arm and bilateral leg numbness that occurred at 11:30 AM this morning.  Patient states that she was walking to the back of her work and sat down when she became lightheaded and had blurry vision which lasted a few minutes.  She states that her left arm became numb which lasted for 3 hours in both of her legs became numb which lasted for 1 hour.  Patient denies weakness of these extremities, she states that he just felt very tingly in all the extremities.  Patient states that she drank two bottles of water and began to feel better and then presented to the emergency department.  Patient denying numbness or tingling at time of evaluation.  Patient states that she has been seen by her OB/GYN in the past for similar symptoms and was informed to wear compression stockings and increase salt intake.  Patient states that her OB/GYN thinks that it is a vascular issue.  Patient states that she was not wearing her compression stockings today.  Patient denies abdominal pain, vaginal bleeding, change in vaginal discharge, chest pain or shortness of breath.  Patient states that her baby is still moving well and normally.  Patient denies fever, nausea/vomiting, diarrhea, recent illness, decreased fluid intake.  HPI  Past Medical History:  Diagnosis Date  . Hx of chlamydia infection   . Medical history non-contributory     Patient Active Problem List   Diagnosis Date Noted  . Supervision of other normal pregnancy, antepartum 10/11/2017    Past Surgical History:  Procedure Laterality Date  . WISDOM TOOTH EXTRACTION       OB History    Gravida  2   Para  1   Term  1   Preterm      AB      Living  1     SAB      TAB      Ectopic      Multiple  0   Live Births  1            Home Medications    Prior to Admission medications   Medication Sig Start Date End Date Taking? Authorizing Provider  calcium carbonate (TUMS - DOSED IN MG ELEMENTAL CALCIUM) 500 MG chewable tablet Chew 1 tablet by mouth daily.   Yes [provider]  doxylamine, Sleep, (UNISOM) 25 MG tablet Take 1 tablet (25 mg total) by mouth 4 (four) times daily as needed (nausea and vomiting). Patient taking differently: Take 25 mg by mouth 4 (four) times daily as needed for sleep.  10/11/17  Yes Schuman, Christanna R, MD  amLODipine (NORVASC) 2.5 MG tablet Take 2 tablets (5 mg total) by mouth daily. Patient not taking: Reported on 09/23/2016 08/20/14   Aviva Signs, CNM  benzonatate (TESSALON) 100 MG capsule Take 1 capsule (100 mg total) by mouth every 8 (eight) hours. Patient not taking: Reported on 10/11/2017 09/23/16   Dorena Bodo, NP  cephALEXin (KEFLEX) 500 MG capsule Take 1 capsule (500 mg total) by mouth 2 (two) times daily for 5 days. 04/28/18 05/03/18  Harlene Salts A, PA-C  clobetasol ointment (TEMOVATE) 0.05 % Apply 1 application topically 2 (two) times daily.  Apply to affected area Patient not taking: Reported on 10/11/2017 08/20/14   Aviva SignsWilliams, Marie L, CNM  cyproheptadine (PERIACTIN) 4 MG tablet Take 1 tablet (4 mg total) by mouth 3 (three) times daily as needed for allergies. Patient not taking: Reported on 09/23/2016 10/09/15   Menshew, Charlesetta IvoryJenise V Bacon, PA-C  docusate sodium (COLACE) 100 MG capsule Take 1 capsule (100 mg total) by mouth 2 (two) times daily as needed. Patient not taking: Reported on 08/24/2014 08/17/14   Leftwich-Kirby, Wilmer FloorLisa A, CNM  hydrochlorothiazide (HYDRODIURIL) 25 MG tablet Take 1 tablet (25 mg total) by mouth daily. Patient not taking: Reported on 09/23/2016 08/20/14   Aviva SignsWilliams, Marie L, CNM  HYDROcodone-acetaminophen (NORCO/VICODIN) 5-325 MG tablet Take 1 tablet by mouth  every 4 (four) hours as needed. Patient not taking: Reported on 10/11/2017 09/06/15   Kem Boroughsriplett, Cari B, FNP  ibuprofen (MOTRIN IB) 200 MG tablet Take 3 tablets (600 mg total) by mouth every 8 (eight) hours as needed for mild pain or moderate pain. Patient not taking: Reported on 10/11/2017 02/15/15   Renford DillsMiller, Lindsey, NP  loratadine (CLARITIN) 10 MG tablet Take 1 tablet (10 mg total) by mouth daily. Patient not taking: Reported on 09/23/2016 08/20/14   Aviva SignsWilliams, Marie L, CNM  mupirocin ointment Idelle Jo(BACTROBAN) 2 % Apply to affected area 3 times daily Patient not taking: Reported on 09/23/2016 10/12/15   Menshew, Charlesetta IvoryJenise V Bacon, PA-C  pyridOXINE (VITAMIN B-6) 25 MG tablet Take 1 tablet (25 mg total) by mouth daily. Patient not taking: Reported on 04/28/2018 10/11/17   Natale MilchSchuman, Christanna R, MD  ranitidine (ZANTAC) 150 MG tablet Take 1 tablet (150 mg total) by mouth 2 (two) times daily. Patient not taking: Reported on 10/11/2017 10/09/15   Menshew, Charlesetta IvoryJenise V Bacon, PA-C  traMADol (ULTRAM) 50 MG tablet Take 1 tablet (50 mg total) by mouth 2 (two) times daily. Patient not taking: Reported on 10/11/2017 10/12/15   Menshew, Charlesetta IvoryJenise V Bacon, PA-C    Family History Family History  Problem Relation Age of Onset  . Depression Mother   . Hypertension Mother   . Heart disease Father        clot in heart  . Seizures Father   . Tuberculosis Father   . Depression Father     Social History Social History   Tobacco Use  . Smoking status: Current Some Day Smoker    Types: Cigarettes  . Smokeless tobacco: Never Used  . Tobacco comment: Occasionally 1 cigarette / week  Substance Use Topics  . Alcohol use: Yes  . Drug use: No     Allergies   Mupirocin; Other; Bee venom; and Dermoplast [benzocaine]   Review of Systems Review of Systems  Constitutional: Negative.  Negative for chills, diaphoresis and fever.  HENT: Negative.  Negative for rhinorrhea and sore throat.   Eyes: Negative.  Negative for visual  disturbance.  Respiratory: Negative.  Negative for cough and shortness of breath.   Cardiovascular: Negative.  Negative for chest pain and leg swelling.  Gastrointestinal: Negative.  Negative for abdominal pain, blood in stool, diarrhea, nausea and vomiting.  Genitourinary: Negative.  Negative for difficulty urinating, dysuria, flank pain, hematuria, pelvic pain, vaginal bleeding, vaginal discharge and vaginal pain.  Musculoskeletal: Negative.  Negative for arthralgias and myalgias.  Skin: Negative.  Negative for rash.  Neurological: Positive for dizziness, light-headedness and numbness. Negative for syncope, weakness and headaches.    Physical Exam Updated Vital Signs BP 119/84   Pulse 73   Temp 98.6 F (37  C) (Oral)   Resp 16   LMP 09/10/2017 (Exact Date)   SpO2 98%   Physical Exam  Constitutional: She is oriented to person, place, and time. She appears well-developed and well-nourished. No distress.  HENT:  Head: Normocephalic and atraumatic.  Right Ear: External ear normal.  Left Ear: External ear normal.  Nose: Nose normal.  Eyes: Pupils are equal, round, and reactive to light. EOM are normal.  Neck: Trachea normal and normal range of motion. No tracheal deviation present.  Cardiovascular: Normal rate, regular rhythm, normal heart sounds and intact distal pulses.  Pulmonary/Chest: Effort normal and breath sounds normal. No respiratory distress.  Abdominal: Soft. Bowel sounds are normal. There is no tenderness. There is no rebound and no guarding.  Pregnant.  Genitourinary:  Genitourinary Comments: Deferred by patient.  Musculoskeletal: Normal range of motion. She exhibits no edema or deformity.  Patient ambulatory in emergency department multiple times without difficulty or assistance.  Neurological: She is alert and oriented to person, place, and time. She has normal strength. She displays no tremor. No cranial nerve deficit or sensory deficit. She displays a negative  Romberg sign. Coordination and gait normal.  Mental Status: Alert, oriented, thought content appropriate, able to give a coherent history. Speech fluent without evidence of aphasia. Able to follow 2 step commands without difficulty. Cranial Nerves: II: Peripheral visual fields grossly normal, pupils equal, round, reactive to light III,IV, VI: ptosis not present, extra-ocular motions intact bilaterally V,VII: smile symmetric, eyebrows raise symmetric, facial light touch sensation equal VIII: hearing grossly normal to voice X: uvula elevates symmetrically XI: bilateral shoulder shrug symmetric and strong XII: midline tongue extension without fassiculations Motor: Normal tone. 5/5 strength in upper and lower extremities bilaterally including strong and equal grip strength and dorsiflexion/plantar flexion Sensory: Sensation intact to light touch in all extremities. Cerebellar: normal finger-to-nose with bilateral upper extremities. Normal heel-to -shin balance bilaterally of the lower extremity. No pronator drift.  Gait: normal gait and balance CV: distal pulses palpable throughout  Skin: Skin is warm and dry.  Psychiatric: She has a normal mood and affect. Her behavior is normal.     ED Treatments / Results  Labs (all labs ordered are listed, but only abnormal results are displayed) Labs Reviewed  CBC WITH DIFFERENTIAL/PLATELET - Abnormal; Notable for the following components:      Result Value   WBC 10.8 (*)    RBC 3.84 (*)    Hemoglobin 11.8 (*)    Neutro Abs 8.1 (*)    All other components within normal limits  BASIC METABOLIC PANEL - Abnormal; Notable for the following components:   CO2 21 (*)    BUN <5 (*)    All other components within normal limits  URINALYSIS, ROUTINE W REFLEX MICROSCOPIC - Abnormal; Notable for the following components:   APPearance CLOUDY (*)    Ketones, ur 20 (*)    Leukocytes, UA SMALL (*)    Bacteria, UA MANY (*)    All other components  within normal limits  URINE CULTURE    EKG None  Radiology No results found.  Procedures Procedures (including critical care time)  Medications Ordered in ED Medications - No data to display   Initial Impression / Assessment and Plan / ED Course  I have reviewed the triage vital signs and the nursing notes.  Pertinent labs & imaging results that were available during my care of the patient were reviewed by me and considered in my medical decision making (see chart  for details).  Clinical Course as of Apr 29 2155  Wynelle Link Apr 28, 2018  1748 Medical chart reviewed shows patient had postpartum hypertension in 2015.  This was discussed with Dr. Estell Harpin today.  Patient's highest systolic blood pressure in department has been 124 and highest diastolic has been 86.  Patient states she has close reliable follow-up with her OB/GYN.  Patient without hypertension in department, normal neurologic exam feels safe for patient discharge and close OB follow-up.   [BM]    Clinical Course User Index [BM] Bill Salinas, PA-C    34-week and 6-day pregnant patient presenting for lightheadedness and left arm and bilateral leg numbness/tingling that occurred while patient was at work today.  Symptoms resolved with rest and water prior to arrival.  No focal neuro deficits, normal neurologic exam today.  Patient states she has had similar symptoms in the past and has been evaluated by her OB/GYN and informed to wear compression stockings.  Patient is alert and oriented x4, sitting comfortably in bed no acute distress.  Patient is not hypertensive here in department all blood pressures below 130 systolic and below 90 diastolic. No proteinuria on urinalysis. BMP nonacute, CBC shows mildly elevated white count.  Urinalysis shows possible urinary tract infection, sent for culture, patient asymptomatic. Patient being treated for UTI with Keflex 500 twice daily.  This was discussed with Dr. Estell Harpin who agrees with  treatment.  Patient states that she has reliable follow-up with her OB/GYN and will call tomorrow morning to schedule an appointment for as soon as possible.  Fetal heart tones of 138 in department.  Patient states that she can feel her baby move normally while in department today.  Denies contractions, denies vaginal bleeding or abdominal/pelvic pain.  Patient denies headache.  Patient states that she is feeling well and is ready to be discharged at this time.  At this time there does not appear to be any evidence of an acute emergency medical condition and the patient appears stable for discharge with appropriate outpatient follow up. Diagnosis was discussed with patient who verbalizes understanding of care plan and is agreeable to discharge. I have discussed return precautions with patient who verbalizes understanding of return precautions. Patient strongly encouraged to follow-up with their OB/GYN and PCP as soon as possible. All questions answered.  Patient's case discussed with Dr. Estell Harpin who agrees with plan to discharge with follow-up.     Note: Portions of this report may have been transcribed using voice recognition software. Every effort was made to ensure accuracy; however, inadvertent computerized transcription errors may still be present.   Final Clinical Impressions(s) / ED Diagnoses   Final diagnoses:  Numbness and tingling  Pregnancy, unspecified gestational age  Lower urinary tract infection    ED Discharge Orders         Ordered    cephALEXin (KEFLEX) 500 MG capsule  2 times daily     04/28/18 1933           Elizabeth Palau 04/28/18 2237    Bethann Berkshire, MD 04/30/18 1158

## 2018-04-28 NOTE — ED Triage Notes (Signed)
[redacted] weeks pregnant.  G2P1.  Complications with this pregnancy, at 32 weeks pt started having intermittant numbness in left arm and bilateral legs.  [facility] advised to increase salt intake, compression socks and BP was slightly decreased.  No vaginal bleeding, leaking fluid, normal braxton hicks contractions.  + fetal movement.    Pt states increase in vaginal discharge several weeks ago, no color change, Dr. Rana SnareLowe aware.  Onset today while working pt starting having same symptoms, numbness in left arm and bilateral legs.

## 2018-04-29 NOTE — ED Notes (Signed)
Rapid OB nurse called, no further interventions indicated at this time

## 2018-04-30 LAB — URINE CULTURE

## 2018-05-06 LAB — OB RESULTS CONSOLE GBS: GBS: NEGATIVE

## 2018-06-03 ENCOUNTER — Encounter (HOSPITAL_COMMUNITY): Payer: Self-pay | Admitting: *Deleted

## 2018-06-03 ENCOUNTER — Telehealth (HOSPITAL_COMMUNITY): Payer: Self-pay | Admitting: *Deleted

## 2018-06-03 NOTE — Telephone Encounter (Signed)
Preadmission screen  

## 2018-06-04 ENCOUNTER — Encounter (HOSPITAL_COMMUNITY): Payer: Self-pay | Admitting: *Deleted

## 2018-06-04 ENCOUNTER — Telehealth (HOSPITAL_COMMUNITY): Payer: Self-pay | Admitting: *Deleted

## 2018-06-04 NOTE — Telephone Encounter (Signed)
Preadmission screen  

## 2018-06-11 ENCOUNTER — Other Ambulatory Visit: Payer: Self-pay

## 2018-06-11 ENCOUNTER — Inpatient Hospital Stay (HOSPITAL_COMMUNITY): Payer: 59 | Admitting: Anesthesiology

## 2018-06-11 ENCOUNTER — Inpatient Hospital Stay (HOSPITAL_COMMUNITY)
Admission: RE | Admit: 2018-06-11 | Discharge: 2018-06-12 | DRG: 807 | Disposition: A | Discharge status: Home / Self Care | Payer: 59 | Attending: Obstetrics and Gynecology | Admitting: Obstetrics and Gynecology

## 2018-06-11 ENCOUNTER — Encounter (HOSPITAL_COMMUNITY): Payer: Self-pay

## 2018-06-11 VITALS — BP 118/79 | HR 62 | Temp 98.4°F | Resp 18 | Ht 60.0 in | Wt 153.0 lb

## 2018-06-11 DIAGNOSIS — Z3A40 40 weeks gestation of pregnancy: Secondary | ICD-10-CM

## 2018-06-11 DIAGNOSIS — O480 Post-term pregnancy: Secondary | ICD-10-CM | POA: Diagnosis present

## 2018-06-11 DIAGNOSIS — Z3480 Encounter for supervision of other normal pregnancy, unspecified trimester: Secondary | ICD-10-CM

## 2018-06-11 DIAGNOSIS — Z3490 Encounter for supervision of normal pregnancy, unspecified, unspecified trimester: Secondary | ICD-10-CM | POA: Diagnosis present

## 2018-06-11 LAB — TYPE AND SCREEN
ABO/RH(D): A POS
ANTIBODY SCREEN: NEGATIVE

## 2018-06-11 LAB — CBC
HCT: 35.8 % — ABNORMAL LOW (ref 36.0–46.0)
HEMOGLOBIN: 11.9 g/dL — AB (ref 12.0–15.0)
MCH: 30 pg (ref 26.0–34.0)
MCHC: 33.2 g/dL (ref 30.0–36.0)
MCV: 90.2 fL (ref 78.0–100.0)
Platelets: 177 10*3/uL (ref 150–400)
RBC: 3.97 MIL/uL (ref 3.87–5.11)
RDW: 13 % (ref 11.5–15.5)
WBC: 9.5 10*3/uL (ref 4.0–10.5)

## 2018-06-11 MED ORDER — ACETAMINOPHEN 325 MG PO TABS: 650.0000 mg | ORAL_TABLET | ORAL | Status: DC | PRN

## 2018-06-11 MED ORDER — PHENYLEPHRINE 40 MCG/ML (10ML) SYRINGE FOR IV PUSH (FOR BLOOD PRESSURE SUPPORT)
80.0000 ug | PREFILLED_SYRINGE | INTRAVENOUS | Status: DC | PRN
  Filled 2018-06-11: qty 5

## 2018-06-11 MED ORDER — DIPHENHYDRAMINE HCL 50 MG/ML IJ SOLN: 12.5000 mg | INTRAMUSCULAR | Status: DC | PRN

## 2018-06-11 MED ORDER — BUTORPHANOL TARTRATE 1 MG/ML IJ SOLN: 1.0000 mg | INTRAMUSCULAR | Status: DC | PRN

## 2018-06-11 MED ORDER — EPHEDRINE 5 MG/ML INJ
10.0000 mg | INTRAVENOUS | Status: DC | PRN
  Filled 2018-06-11: qty 2

## 2018-06-11 MED ORDER — OXYCODONE-ACETAMINOPHEN 5-325 MG PO TABS
1.0000 | ORAL_TABLET | ORAL | Status: DC | PRN
  Administered 2018-06-11: 1 via ORAL
  Filled 2018-06-11: qty 1

## 2018-06-11 MED ORDER — IBUPROFEN 600 MG PO TABS
600.0000 mg | ORAL_TABLET | Freq: Four times a day (QID) | ORAL | Status: DC
  Administered 2018-06-11 – 2018-06-12 (×4): 600 mg via ORAL
  Filled 2018-06-11 (×4): qty 1

## 2018-06-11 MED ORDER — OXYCODONE-ACETAMINOPHEN 5-325 MG PO TABS
1.0000 | ORAL_TABLET | ORAL | Status: DC | PRN
  Administered 2018-06-12 (×3): 1 via ORAL
  Filled 2018-06-11 (×3): qty 1

## 2018-06-11 MED ORDER — DIPHENHYDRAMINE HCL 25 MG PO CAPS: 25.0000 mg | ORAL_CAPSULE | Freq: Four times a day (QID) | ORAL | Status: DC | PRN

## 2018-06-11 MED ORDER — TERBUTALINE SULFATE 1 MG/ML IJ SOLN
0.2500 mg | Freq: Once | INTRAMUSCULAR | Status: DC | PRN
  Filled 2018-06-11: qty 1

## 2018-06-11 MED ORDER — MEASLES, MUMPS & RUBELLA VAC ~~LOC~~ INJ: 0.5000 mL | INJECTION | Freq: Once | SUBCUTANEOUS | Status: DC

## 2018-06-11 MED ORDER — ONDANSETRON HCL 4 MG/2ML IJ SOLN: 4.0000 mg | INTRAMUSCULAR | Status: DC | PRN

## 2018-06-11 MED ORDER — DIBUCAINE 1 % RE OINT: 1.0000 "application " | TOPICAL_OINTMENT | RECTAL | Status: DC | PRN

## 2018-06-11 MED ORDER — PRENATAL MULTIVITAMIN CH
1.0000 | ORAL_TABLET | Freq: Every day | ORAL | Status: DC
  Administered 2018-06-12: 1 via ORAL
  Filled 2018-06-11: qty 1

## 2018-06-11 MED ORDER — ONDANSETRON HCL 4 MG PO TABS: 4.0000 mg | ORAL_TABLET | ORAL | Status: DC | PRN

## 2018-06-11 MED ORDER — LIDOCAINE HCL (PF) 1 % IJ SOLN
30.0000 mL | INTRAMUSCULAR | Status: DC | PRN
  Filled 2018-06-11: qty 30

## 2018-06-11 MED ORDER — LIDOCAINE HCL (PF) 1 % IJ SOLN
INTRAMUSCULAR | Status: DC | PRN
  Administered 2018-06-11: 8 mL via EPIDURAL

## 2018-06-11 MED ORDER — LACTATED RINGERS IV SOLN: 500.0000 mL | INTRAVENOUS | Status: DC | PRN

## 2018-06-11 MED ORDER — PHENYLEPHRINE 40 MCG/ML (10ML) SYRINGE FOR IV PUSH (FOR BLOOD PRESSURE SUPPORT)
80.0000 ug | PREFILLED_SYRINGE | INTRAVENOUS | Status: DC | PRN
  Filled 2018-06-11: qty 5
  Filled 2018-06-11: qty 10

## 2018-06-11 MED ORDER — SENNOSIDES-DOCUSATE SODIUM 8.6-50 MG PO TABS
2.0000 | ORAL_TABLET | ORAL | Status: DC
  Administered 2018-06-11: 2 via ORAL
  Filled 2018-06-11: qty 2

## 2018-06-11 MED ORDER — OXYCODONE-ACETAMINOPHEN 5-325 MG PO TABS: 2.0000 | ORAL_TABLET | ORAL | Status: DC | PRN

## 2018-06-11 MED ORDER — PHENYLEPHRINE 40 MCG/ML (10ML) SYRINGE FOR IV PUSH (FOR BLOOD PRESSURE SUPPORT)
PREFILLED_SYRINGE | INTRAVENOUS | Status: AC
  Filled 2018-06-11: qty 20

## 2018-06-11 MED ORDER — SIMETHICONE 80 MG PO CHEW: 80.0000 mg | CHEWABLE_TABLET | ORAL | Status: DC | PRN

## 2018-06-11 MED ORDER — OXYTOCIN 40 UNITS IN LACTATED RINGERS INFUSION - SIMPLE MED: 2.5000 [IU]/h | INTRAVENOUS | Status: DC

## 2018-06-11 MED ORDER — OXYCODONE-ACETAMINOPHEN 5-325 MG PO TABS
2.0000 | ORAL_TABLET | ORAL | Status: DC | PRN
  Administered 2018-06-12: 2 via ORAL
  Filled 2018-06-11: qty 2

## 2018-06-11 MED ORDER — FENTANYL 2.5 MCG/ML BUPIVACAINE 1/10 % EPIDURAL INFUSION (WH - ANES)
14.0000 mL/h | INTRAMUSCULAR | Status: DC | PRN
  Administered 2018-06-11 (×2): 14 mL/h via EPIDURAL
  Filled 2018-06-11 (×2): qty 100

## 2018-06-11 MED ORDER — OXYTOCIN BOLUS FROM INFUSION
500.0000 mL | Freq: Once | INTRAVENOUS | Status: AC
  Administered 2018-06-11: 500 mL via INTRAVENOUS

## 2018-06-11 MED ORDER — LACTATED RINGERS IV SOLN
500.0000 mL | Freq: Once | INTRAVENOUS | Status: AC
  Administered 2018-06-11: 500 mL via INTRAVENOUS

## 2018-06-11 MED ORDER — MEDROXYPROGESTERONE ACETATE 150 MG/ML IM SUSP: 150.0000 mg | INTRAMUSCULAR | Status: DC | PRN

## 2018-06-11 MED ORDER — OXYTOCIN 40 UNITS IN LACTATED RINGERS INFUSION - SIMPLE MED
1.0000 m[IU]/min | INTRAVENOUS | Status: DC
  Administered 2018-06-11: 2 m[IU]/min via INTRAVENOUS
  Filled 2018-06-11: qty 1000

## 2018-06-11 MED ORDER — SOD CITRATE-CITRIC ACID 500-334 MG/5ML PO SOLN: 30.0000 mL | ORAL | Status: DC | PRN

## 2018-06-11 MED ORDER — ONDANSETRON HCL 4 MG/2ML IJ SOLN
4.0000 mg | Freq: Four times a day (QID) | INTRAMUSCULAR | Status: DC | PRN
  Administered 2018-06-11: 4 mg via INTRAVENOUS
  Filled 2018-06-11: qty 2

## 2018-06-11 MED ORDER — LACTATED RINGERS IV SOLN
INTRAVENOUS | Status: DC
  Administered 2018-06-11 (×2): via INTRAVENOUS

## 2018-06-11 MED ORDER — COCONUT OIL OIL: 1.0000 "application " | TOPICAL_OIL | Status: DC | PRN

## 2018-06-11 MED ORDER — ZOLPIDEM TARTRATE 5 MG PO TABS: 5.0000 mg | ORAL_TABLET | Freq: Every evening | ORAL | Status: DC | PRN

## 2018-06-11 MED ORDER — TETANUS-DIPHTH-ACELL PERTUSSIS 5-2.5-18.5 LF-MCG/0.5 IM SUSP: 0.5000 mL | Freq: Once | INTRAMUSCULAR | Status: DC

## 2018-06-11 MED ORDER — WITCH HAZEL-GLYCERIN EX PADS: 1.0000 "application " | MEDICATED_PAD | CUTANEOUS | Status: DC | PRN

## 2018-06-11 NOTE — Anesthesia Preprocedure Evaluation (Signed)

## 2018-06-11 NOTE — Anesthesia Pain Management Evaluation Note (Signed)
  [facility] Pain Management Visit Note  Patient: Olivia Obrien, 27 y.o., female  "Hello I am a member of the anesthesia team at [facility]. We have an anesthesia team available at all times to provide care throughout the hospital, including epidural management and anesthesia for C-section. I don't know your plan for the delivery whether it a natural birth, water birth, IV sedation, nitrous supplementation, doula or epidural, but we want to meet your pain goals."   1.Was your pain managed to your expectations on prior hospitalizations?   Yes   2.What is your expectation for pain management during this hospitalization?     Epidural  3.How can we help you reach that goal? Place epidural when pain goal is reached.  Record the patient's initial score and the patient's pain goal.   Pain: 5  Pain Goal: 7 [facility] wants you to be able to say your pain was always managed very well.  Ivie Maese 06/11/2018

## 2018-06-11 NOTE — Anesthesia Procedure Notes (Signed)
Epidural Patient location during procedure: OB Start time: 06/11/2018 1:45 PM End time: 06/11/2018 1:55 PM  Staffing Anesthesiologist: Bethena Midget, MD  Preanesthetic Checklist Completed: patient identified, site marked, surgical consent, pre-op evaluation, timeout performed, IV checked, risks and benefits discussed and monitors and equipment checked  Epidural Patient position: sitting Prep: site prepped and draped and DuraPrep Patient monitoring: continuous pulse ox and blood pressure Approach: midline Location: L4-L5 Injection technique: LOR air  Needle:  Needle type: Tuohy  Needle gauge: 17 G Needle length: 9 cm and 9 Needle insertion depth: 5 cm cm Catheter type: closed end flexible Catheter size: 19 Gauge Catheter at skin depth: 10 cm Test dose: negative  Assessment Events: blood not aspirated, injection not painful, no injection resistance, negative IV test and no paresthesia

## 2018-06-11 NOTE — H&P (Signed)
Olivia Obrien is a 27 y.o. female presenting for IOL due to postdates.  Pregnancy uncomplicated. GBS-. OB History    Gravida  2   Para  1   Term  1   Preterm      AB      Living  1     SAB      TAB      Ectopic      Multiple  0   Live Births  1          Past Medical History:  Diagnosis Date  . History of pre-eclampsia in prior pregnancy, currently pregnant   . Hx of chlamydia infection   . Medical history non-contributory    Past Surgical History:  Procedure Laterality Date  . WISDOM TOOTH EXTRACTION     Family History: family history includes Depression in her father and mother; Diabetes in her paternal grandfather; Heart disease in her father and maternal grandfather; Hypertension in her mother; Seizures in her father; Tuberculosis in her father. Social History:  reports that she has never smoked. She has never used smokeless tobacco. She reports that she drinks alcohol. She reports that she does not use drugs.     Maternal Diabetes: No Genetic Screening: Normal Maternal Ultrasounds/Referrals: Normal Fetal Ultrasounds or other Referrals:  None Maternal Substance Abuse:  No Significant Maternal Medications:  None Significant Maternal Lab Results:  None Other Comments:  None  ROS History   Height 5' (1.524 m), weight 69.4 kg, last menstrual period 09/10/2017. Exam Physical Exam  2.5/50/-3 AROM Prenatal labs: ABO, Rh: A/Positive/-- (01/31 1604) Antibody: Negative (01/31 1604) Rubella: 1.27 (01/31 1604) RPR: Non Reactive (01/31 1604)  HBsAg: Negative (01/31 1604)  HIV: Non Reactive (01/31 1604)  GBS: Negative (08/26 0000)   Assessment/Plan: IUP at  40+ for IOL AROM/Pit and anticipate SVD  Turner Daniels 06/11/2018, 12:36 PM

## 2018-06-12 LAB — CBC
HEMATOCRIT: 29.1 % — AB (ref 36.0–46.0)
HEMOGLOBIN: 9.8 g/dL — AB (ref 12.0–15.0)
MCH: 30.4 pg (ref 26.0–34.0)
MCHC: 33.7 g/dL (ref 30.0–36.0)
MCV: 90.4 fL (ref 78.0–100.0)
Platelets: 153 10*3/uL (ref 150–400)
RBC: 3.22 MIL/uL — AB (ref 3.87–5.11)
RDW: 13.1 % (ref 11.5–15.5)
WBC: 11.7 10*3/uL — ABNORMAL HIGH (ref 4.0–10.5)

## 2018-06-12 LAB — RPR: RPR Ser Ql: NONREACTIVE

## 2018-06-12 MED ORDER — IBUPROFEN 600 MG PO TABS: 600.0000 mg | ORAL_TABLET | Freq: Four times a day (QID) | ORAL | 0 refills | Status: DC

## 2018-06-12 NOTE — Progress Notes (Addendum)
MOB was referred for history of depression/anxiety. * Referral screened out by Clinical Social Worker because none of the following criteria appear to apply: ~ History of anxiety/depression during this pregnancy, or of post-partum depression following prior delivery. ~ Diagnosis of anxiety and/or depression within last 3 years OR * MOB's symptoms currently being treated with medication and/or therapy; Per OB records MOB had anx/depression in high school.  No other concerns noted in OB record.   Please contact the Clinical Social Worker if needs arise, by [facility] request, or if MOB scores greater than 9/yes to question 10 on Edinburgh Postpartum Depression Screen.  CSW received consult for hx of marijuana use.  Referral was screened out due to the following: ~MOB had no documented substance use after initial prenatal visit/+UPT. ~MOB had no positive drug screens after initial prenatal visit/+UPT. ~Baby's UDS is negative.  CSW will monitor CDS results and make report to Child Protective Services if warranted.  Blaine Hamper, MSW, LCSW Clinical Social Work [phone]

## 2018-06-12 NOTE — Discharge Summary (Signed)
Obstetric Discharge Summary Reason for Admission: induction of labor Prenatal Procedures: none Intrapartum Procedures: spontaneous vaginal delivery Postpartum Procedures: none Complications-Operative and Postpartum: none Hemoglobin  Date Value Ref Range Status  06/12/2018 9.8 (L) 12.0 - 15.0 g/dL Final  54/05/8118 14.7 11.1 - 15.9 g/dL Final   HCT  Date Value Ref Range Status  06/12/2018 29.1 (L) 36.0 - 46.0 % Final   Hematocrit  Date Value Ref Range Status  10/11/2017 43.7 34.0 - 46.6 % Final    Physical Exam:  General: alert, cooperative and appears stated age 27: appropriate Uterine Fundus: firm Incision: healing well, no significant drainage, no dehiscence DVT Evaluation: No evidence of DVT seen on physical exam.  Discharge Diagnoses: Term Pregnancy-delivered  Discharge Information: Date: 06/12/2018 Activity: pelvic rest Diet: routine Medications: Ibuprofen Condition: stable Instructions: refer to practice specific booklet Discharge to: home   Newborn Data: Live born female  Birth Weight: 8 lb 4.6 oz (3759 g) APGAR: 9, 9  Newborn Delivery   Birth date/time:  06/11/2018 19:49:00 Delivery type:  Vaginal, Spontaneous     Home with mother.  Matther Labell L 06/12/2018, 9:21 AM

## 2018-06-12 NOTE — Anesthesia Postprocedure Evaluation (Signed)
Anesthesia Post Note  Patient: [place] M Scicchitano  Procedure(s) Performed: AN AD HOC LABOR EPIDURAL     Patient location during evaluation: Mother Baby Anesthesia Type: Epidural Level of consciousness: awake, awake and alert and oriented Pain management: pain level controlled Vital Signs Assessment: post-procedure vital signs reviewed and stable Respiratory status: spontaneous breathing, nonlabored ventilation and respiratory function stable Cardiovascular status: stable Postop Assessment: no headache, no backache, patient able to bend at knees, able to ambulate, adequate PO intake and no apparent nausea or vomiting Anesthetic complications: no    Last Vitals:  Vitals:   06/12/18 0305 06/12/18 0637  BP: 114/81 118/79  Pulse: 61 62  Resp: 18 18  Temp: 36.7 C 36.9 C  SpO2: 98% 98%    Last Pain:  Vitals:   06/12/18 0638  TempSrc:   PainSc: 7    Pain Goal: Patients Stated Pain Goal: 5 (06/11/18 2030)               Gavriel Holzhauer

## 2018-06-13 ENCOUNTER — Inpatient Hospital Stay (HOSPITAL_COMMUNITY): Admission: RE | Admit: 2018-06-13 | Payer: 59 | Source: Ambulatory Visit

## 2018-07-16 ENCOUNTER — Other Ambulatory Visit: Payer: Self-pay

## 2018-07-16 ENCOUNTER — Emergency Department
Admission: EM | Admit: 2018-07-16 | Discharge: 2018-07-16 | Disposition: A | Discharge status: Home / Self Care | Payer: 59 | Attending: Student in an Organized Health Care Education/Training Program | Admitting: Student in an Organized Health Care Education/Training Program

## 2018-07-16 ENCOUNTER — Encounter: Payer: Self-pay | Admitting: Emergency Medicine

## 2018-07-16 DIAGNOSIS — L232 Allergic contact dermatitis due to cosmetics: Secondary | ICD-10-CM | POA: Diagnosis not present

## 2018-07-16 DIAGNOSIS — F17210 Nicotine dependence, cigarettes, uncomplicated: Secondary | ICD-10-CM | POA: Diagnosis not present

## 2018-07-16 DIAGNOSIS — L299 Pruritus, unspecified: Secondary | ICD-10-CM | POA: Diagnosis present

## 2018-07-16 MED ORDER — DEXAMETHASONE SODIUM PHOSPHATE 10 MG/ML IJ SOLN
10.0000 mg | Freq: Once | INTRAMUSCULAR | Status: AC
  Administered 2018-07-16: 10 mg via INTRAMUSCULAR
  Filled 2018-07-16: qty 1

## 2018-07-16 MED ORDER — PREDNISONE 50 MG PO TABS: 50.0000 mg | ORAL_TABLET | Freq: Every day | ORAL | 0 refills | Status: DC

## 2018-07-16 NOTE — ED Triage Notes (Addendum)
Patient ambulatory to triage with steady gait, without difficulty or distress noted, very talkative; pt reports itchy rash, swelling to face today to possible hair dye, st has noted her lymph nodes are swollen and tender as well to neck; 2 benadryl taken at 11am and again 1oz liquid at 4pm with only mild relief; pt reports hx of allergic rx in past that was relieved by steroid inj

## 2018-07-16 NOTE — ED Provider Notes (Signed)
[facility] Emergency Department Provider Note  ____________________________________________  Time seen: Approximately 8:55 PM  I have reviewed the triage vital signs and the nursing notes.   HISTORY  Chief Complaint Allergic Reaction    HPI Olivia Obrien is a 27 y.o. female who presents the emergency department complaining of allergic reaction.  Patient reports that she has extremely sensitive skin, typically uses unscented products.  Patient reports that she was having her hair done at her beautician, states that she began to develop an itchy scalp after hairstyling.  Patient reports that she called her beautician, was informed that she had used a new shampoo.  Patient reports that her skin scalp has been itching, and she has had some mild rash to bilateral face.  Patient denies any difficulty breathing.  She denies any hives or urticaria underwear on her body.  Patient has been using Benadryl which improved symptoms but typically after 4 5 hours symptoms return.  No other complaints at this time.  Patient reports that she has had allergic reactions in the past and needs a "steroid shot."    Past Medical History:  Diagnosis Date  . History of pre-eclampsia in prior pregnancy, currently pregnant   . Hx of chlamydia infection   . Medical history non-contributory     Patient Active Problem List   Diagnosis Date Noted  . Encounter for induction of labor 06/11/2018  . Supervision of other normal pregnancy, antepartum 10/11/2017    Past Surgical History:  Procedure Laterality Date  . WISDOM TOOTH EXTRACTION      Prior to Admission medications   Medication Sig Start Date End Date Taking? Authorizing Provider  ibuprofen (ADVIL,MOTRIN) 600 MG tablet Take 1 tablet (600 mg total) by mouth every 6 (six) hours. 06/12/18   Marcelle Overlie, MD  predniSONE (DELTASONE) 50 MG tablet Take 1 tablet (50 mg total) by mouth daily with breakfast. 07/16/18   Paydon Carll,  Delorise Royals, PA-C    Allergies Mupirocin; Other; Bee venom; and Dermoplast [benzocaine]  Family History  Problem Relation Age of Onset  . Depression Mother   . Hypertension Mother   . Heart disease Father        clot in heart  . Seizures Father   . Tuberculosis Father   . Depression Father   . Heart disease Maternal Grandfather        transplant  . Diabetes Paternal Grandfather     Social History Social History   Tobacco Use  . Smoking status: Never Smoker  . Smokeless tobacco: Never Used  . Tobacco comment: Occasionally 1 cigarette / week  Substance Use Topics  . Alcohol use: Yes  . Drug use: No     Review of Systems  Constitutional: No fever/chills Eyes: No visual changes.  ENT: No upper respiratory complaints. Cardiovascular: no chest pain. Respiratory: no cough. No SOB. Gastrointestinal: No abdominal pain.  No nausea, no vomiting.  No diarrhea.  No constipation. Musculoskeletal: Negative for musculoskeletal pain. Skin: Positive for itchy scalp, mild facial rash after new shampoo use Neurological: Negative for headaches, focal weakness or numbness. 10-point ROS otherwise negative.  ____________________________________________   PHYSICAL EXAM:  VITAL SIGNS: ED Triage Vitals  Enc Vitals Group     BP 07/16/18 1946 (!) 143/88     Pulse Rate 07/16/18 1946 (!) 105     Resp 07/16/18 1946 18     Temp 07/16/18 1946 98.7 F (37.1 C)     Temp Source 07/16/18 1946 Oral  SpO2 07/16/18 1946 99 %     Weight 07/16/18 1947 130 lb (59 kg)     Height 07/16/18 1947 5' (1.524 m)     Head Circumference --      Peak Flow --      Pain Score 07/16/18 1946 0     Pain Loc --      Pain Edu? --      Excl. in GC? --      Constitutional: Alert and oriented. Well appearing and in no acute distress. Eyes: Conjunctivae are normal. PERRL. EOMI. Head: No significant hives, wheals.  Patient does have some mild erythema, papular eruption on the scalp.  No facial rash.  No  edema in the perioral or periocular region. ENT:      Ears:       Nose: No congestion/rhinnorhea.      Mouth/Throat: Mucous membranes are moist.  No angioedema or oropharyngeal edema.  Uvula is midline. Neck: No stridor.    Cardiovascular: Normal rate, regular rhythm. Normal S1 and S2.  Good peripheral circulation. Respiratory: Normal respiratory effort without tachypnea or retractions. Lungs CTAB. Good air entry to the bases with no decreased or absent breath sounds. Musculoskeletal: Full range of motion to all extremities. No gross deformities appreciated. Neurologic:  Normal speech and language. No gross focal neurologic deficits are appreciated.  Skin:  Skin is warm, dry and intact. No rash noted. Psychiatric: Mood and affect are normal. Speech and behavior are normal. Patient exhibits appropriate insight and judgement.   ____________________________________________   LABS (all labs ordered are listed, but only abnormal results are displayed)  Labs Reviewed - No data to display ____________________________________________  EKG   ____________________________________________  RADIOLOGY   No results found.  ____________________________________________    PROCEDURES  Procedure(s) performed:    Procedures    Medications  dexamethasone (DECADRON) injection 10 mg (has no administration in time range)     ____________________________________________   INITIAL IMPRESSION / ASSESSMENT AND PLAN / ED COURSE  Pertinent labs & imaging results that were available during my care of the patient were reviewed by me and considered in my medical decision making (see chart for details).  Review of the [place] CSRS was performed in accordance of the [facility] prior to dispensing any controlled drugs.      Patient's diagnosis is consistent with contact dermatitis due to shampoo.  Patient presents emergency department with reaction to shampoo use.  No systemic signs of allergic  reaction.  Patient is given Decadron injection and will be discharged home with 5-day course of prednisone.  And Benadryl at home as needed.  Follow-up with primary care as needed. Patient is given ED precautions to return to the ED for any worsening or new symptoms.     ____________________________________________  FINAL CLINICAL IMPRESSION(S) / ED DIAGNOSES  Final diagnoses:  Allergic contact dermatitis due to cosmetics      NEW MEDICATIONS STARTED DURING THIS VISIT:  ED Discharge Orders         Ordered    predniSONE (DELTASONE) 50 MG tablet  Daily with breakfast     07/16/18 2103              This chart was dictated using voice recognition software/Dragon. Despite best efforts to proofread, errors can occur which can change the meaning. Any change was purely unintentional.    Racheal Patches, PA-C 07/16/18 2104    Willy Eddy, MD 07/16/18 [phone]

## 2018-07-16 NOTE — ED Notes (Signed)
Enlarged lymph glands along jawline.

## 2018-07-16 NOTE — ED Notes (Signed)

## 2020-01-02 ENCOUNTER — Emergency Department
Admission: EM | Admit: 2020-01-02 | Discharge: 2020-01-02 | Disposition: A | Discharge status: Home / Self Care | Payer: 59 | Attending: Student in an Organized Health Care Education/Training Program | Admitting: Student in an Organized Health Care Education/Training Program

## 2020-01-02 ENCOUNTER — Other Ambulatory Visit: Payer: Self-pay

## 2020-01-02 ENCOUNTER — Encounter: Payer: Self-pay | Admitting: Emergency Medicine

## 2020-01-02 DIAGNOSIS — K529 Noninfective gastroenteritis and colitis, unspecified: Secondary | ICD-10-CM | POA: Insufficient documentation

## 2020-01-02 DIAGNOSIS — R112 Nausea with vomiting, unspecified: Secondary | ICD-10-CM | POA: Diagnosis present

## 2020-01-02 DIAGNOSIS — R197 Diarrhea, unspecified: Secondary | ICD-10-CM | POA: Insufficient documentation

## 2020-01-02 DIAGNOSIS — R1084 Generalized abdominal pain: Secondary | ICD-10-CM | POA: Diagnosis not present

## 2020-01-02 LAB — COMPREHENSIVE METABOLIC PANEL
ALT: 24 U/L (ref 0–44)
AST: 18 U/L (ref 15–41)
Albumin: 4.8 g/dL (ref 3.5–5.0)
Alkaline Phosphatase: 45 U/L (ref 38–126)
Anion gap: 11 (ref 5–15)
BUN: 17 mg/dL (ref 6–20)
CO2: 22 mmol/L (ref 22–32)
Calcium: 9.4 mg/dL (ref 8.9–10.3)
Chloride: 105 mmol/L (ref 98–111)
Creatinine, Ser: 0.75 mg/dL (ref 0.44–1.00)
GFR calc Af Amer: >60 mL/min (ref >60)
GFR calc non Af Amer: >60 mL/min (ref >60)
Glucose, Bld: 110 mg/dL — ABNORMAL HIGH (ref 70–99)
Potassium: 3.6 mmol/L (ref 3.5–5.1)
Sodium: 138 mmol/L (ref 135–145)
Total Bilirubin: 1.9 mg/dL — ABNORMAL HIGH (ref 0.3–1.2)
Total Protein: 7.9 g/dL (ref 6.5–8.1)

## 2020-01-02 LAB — URINALYSIS, COMPLETE (UACMP) WITH MICROSCOPIC
Bacteria, UA: NONE SEEN
Bilirubin Urine: NEGATIVE
Glucose, UA: NEGATIVE mg/dL
Hgb urine dipstick: NEGATIVE
Ketones, ur: 80 mg/dL — AB
Leukocytes,Ua: NEGATIVE
Nitrite: NEGATIVE
Protein, ur: NEGATIVE mg/dL
Specific Gravity, Urine: 1.028 (ref 1.005–1.030)
pH: 5 (ref 5.0–8.0)

## 2020-01-02 LAB — CBC
HCT: 45.6 % (ref 36.0–46.0)
Hemoglobin: 15.4 g/dL — ABNORMAL HIGH (ref 12.0–15.0)
MCH: 32.6 pg (ref 26.0–34.0)
MCHC: 33.8 g/dL (ref 30.0–36.0)
MCV: 96.6 fL (ref 80.0–100.0)
Platelets: 196 10*3/uL (ref 150–400)
RBC: 4.72 MIL/uL (ref 3.87–5.11)
RDW: 12.2 % (ref 11.5–15.5)
WBC: 13.5 10*3/uL — ABNORMAL HIGH (ref 4.0–10.5)
nRBC: 0 % (ref 0.0–0.2)

## 2020-01-02 LAB — POCT PREGNANCY, URINE: Preg Test, Ur: NEGATIVE

## 2020-01-02 LAB — LIPASE, BLOOD: Lipase: 21 U/L (ref 11–51)

## 2020-01-02 MED ORDER — SODIUM CHLORIDE 0.9 % IV BOLUS
1000.0000 mL | Freq: Once | INTRAVENOUS | Status: AC
  Administered 2020-01-02: 18:00:00 1000 mL via INTRAVENOUS

## 2020-01-02 MED ORDER — SODIUM CHLORIDE 0.9% FLUSH: 3.0000 mL | Freq: Once | INTRAVENOUS | Status: DC

## 2020-01-02 MED ORDER — ONDANSETRON HCL 4 MG/2ML IJ SOLN
4.0000 mg | Freq: Once | INTRAMUSCULAR | Status: AC
  Administered 2020-01-02: 18:00:00 4 mg via INTRAVENOUS
  Filled 2020-01-02: qty 2

## 2020-01-02 MED ORDER — ONDANSETRON HCL 4 MG PO TABS: 4.0000 mg | ORAL_TABLET | Freq: Every day | ORAL | 0 refills | Status: AC | PRN

## 2020-01-02 NOTE — ED Notes (Signed)
E-signature not working at this time. Pt verbalized understanding of D/C instructions, prescriptions and follow up care with no further questions at this time. Pt in NAD and ambulatory at time of D/C.  

## 2020-01-02 NOTE — ED Triage Notes (Signed)
FIRST NURSE NOTE- pt here for NVD.  Reports thinks she got a stomach bug.  Ambulatory to check in, placed in wheel chair for comfort.  Cannot keep anything down.

## 2020-01-02 NOTE — ED Triage Notes (Signed)
Pt presents to ED via POV with c/o N/V/D, pt states picked up a stomach virus from her children who had same symptoms. Pt states feels dehydrated.

## 2020-01-02 NOTE — ED Provider Notes (Signed)
[facility] Emergency Department Provider Note  ____________________________________________  Time seen: Approximately 6:20 PM  I have reviewed the triage vital signs and the nursing notes.   HISTORY  Chief Complaint Nausea and Emesis    HPI Olivia Obrien is a 29 y.o. female that presents to the emergency department for evaluation of nausea, vomiting, diarrhea today.  Patient states that both of her children were sent home from daycare yesterday due to vomiting and diarrhea.  Patient states that she anticipated getting their stomach bug last night so she ate a large pizza to prepare.  This morning she woke up with vomiting.  She has been unable to tolerate liquids today.  She has some generalized abdominal discomfort but feels this is from the vomiting.  She came to the emergency department concerned that she is dehydrated and requesting IV fluids.  No fevers.  Both of her children are improving now.  Past Medical History:  Diagnosis Date  . History of pre-eclampsia in prior pregnancy, currently pregnant   . Hx of chlamydia infection   . Medical history non-contributory     Patient Active Problem List   Diagnosis Date Noted  . Encounter for induction of labor 06/11/2018  . Supervision of other normal pregnancy, antepartum 10/11/2017    Past Surgical History:  Procedure Laterality Date  . WISDOM TOOTH EXTRACTION      Prior to Admission medications   Medication Sig Start Date End Date Taking? Authorizing Provider  ibuprofen (ADVIL,MOTRIN) 600 MG tablet Take 1 tablet (600 mg total) by mouth every 6 (six) hours. 06/12/18   Dian Queen, MD  ondansetron (ZOFRAN) 4 MG tablet Take 1 tablet (4 mg total) by mouth daily as needed for nausea or vomiting. 01/02/20 01/01/21  Laban Emperor, PA-C  predniSONE (DELTASONE) 50 MG tablet Take 1 tablet (50 mg total) by mouth daily with breakfast. 07/16/18   Cuthriell, Charline Bills, PA-C    Allergies Mupirocin, Other,  Bee venom, and Dermoplast [benzocaine]  Family History  Problem Relation Age of Onset  . Depression Mother   . Hypertension Mother   . Heart disease Father        clot in heart  . Seizures Father   . Tuberculosis Father   . Depression Father   . Heart disease Maternal Grandfather        transplant  . Diabetes Paternal Grandfather     Social History Social History   Tobacco Use  . Smoking status: Never Smoker  . Smokeless tobacco: Never Used  . Tobacco comment: Occasionally 1 cigarette / week  Substance Use Topics  . Alcohol use: Yes  . Drug use: No     Review of Systems  Constitutional: No fever/chills Eyes: No visual changes. No discharge. ENT: Negative for congestion and rhinorrhea. Cardiovascular: No chest pain. Respiratory: Negative for cough. No SOB. Gastrointestinal: Positive for vomiting and diarrhea. Musculoskeletal: Negative for musculoskeletal pain. Skin: Negative for rash, abrasions, lacerations, ecchymosis. Neurological: Negative for headaches.   ____________________________________________   PHYSICAL EXAM:  VITAL SIGNS: ED Triage Vitals [01/02/20 1640]  Enc Vitals Group     BP 115/73     Pulse Rate (!) 105     Resp 18     Temp 98.2 F (36.8 C)     Temp Source Oral     SpO2 100 %     Weight 120 lb (54.4 kg)     Height 5' (1.524 m)     Head Circumference  Peak Flow      Pain Score 0     Pain Loc      Pain Edu?      Excl. in GC?      Constitutional: Alert and oriented. Well appearing and in no acute distress. Eyes: Conjunctivae are normal. PERRL. EOMI. No discharge. Head: Atraumatic. ENT: No frontal and maxillary sinus tenderness.      Ears:       Nose: Mild congestion/rhinnorhea.      Mouth/Throat: Mucous membranes are moist.  Neck: No stridor.   Hematological/Lymphatic/Immunilogical: No cervical lymphadenopathy. Cardiovascular: Normal rate, regular rhythm.  Good peripheral circulation. Respiratory: Normal respiratory effort  without tachypnea or retractions. Lungs CTAB. Good air entry to the bases with no decreased or absent breath sounds. Gastrointestinal: Bowel sounds 4 quadrants. Soft and nontender to palpation. No guarding or rigidity. No palpable masses. No distention. Musculoskeletal: Full range of motion to all extremities. No gross deformities appreciated. Neurologic:  Normal speech and language. No gross focal neurologic deficits are appreciated.  Skin:  Skin is warm, dry and intact. No rash noted. Psychiatric: Mood and affect are normal. Speech and behavior are normal. Patient exhibits appropriate insight and judgement.   ____________________________________________   LABS (all labs ordered are listed, but only abnormal results are displayed)  Labs Reviewed  COMPREHENSIVE METABOLIC PANEL - Abnormal; Notable for the following components:      Result Value   Glucose, Bld 110 (*)    Total Bilirubin 1.9 (*)    All other components within normal limits  CBC - Abnormal; Notable for the following components:   WBC 13.5 (*)    Hemoglobin 15.4 (*)    All other components within normal limits  URINALYSIS, COMPLETE (UACMP) WITH MICROSCOPIC - Abnormal; Notable for the following components:   Color, Urine YELLOW (*)    APPearance CLEAR (*)    Ketones, ur 80 (*)    All other components within normal limits  LIPASE, BLOOD  POC URINE PREG, ED  POCT PREGNANCY, URINE   ____________________________________________  EKG   ____________________________________________  RADIOLOGY  No results found.  ____________________________________________    PROCEDURES  Procedure(s) performed:    Procedures    Medications  sodium chloride flush (NS) 0.9 % injection 3 mL (3 mLs Intravenous Not Given 01/02/20 1823)  ondansetron (ZOFRAN) injection 4 mg (4 mg Intravenous Given 01/02/20 1829)  sodium chloride 0.9 % bolus 1,000 mL (0 mLs Intravenous Stopped 01/02/20 2120)      ____________________________________________   INITIAL IMPRESSION / ASSESSMENT AND PLAN / ED COURSE  Pertinent labs & imaging results that were available during my care of the patient were reviewed by me and considered in my medical decision making (see chart for details).  Review of the [place] CSRS was performed in accordance of the [facility] prior to dispensing any controlled drugs.   Patient's diagnosis is consistent with viral gastroenteritis. Vital signs and exam are reassuring.  Patient has a mild leukocytosis.  Remaining lab work is largely unremarkable.  Pregnancy test is negative.  Patient was given IV fluids and IV Zofran for symptoms.  Following fluids, she felt "like a new woman." Patient is tolerating oral ginger ale post Zofran.  Patient appears well and is staying well hydrated. Patient feels comfortable going home. Patient will be discharged home with prescriptions for zofran. Patient is to follow up with primary care as needed or otherwise directed. Patient is given ED precautions to return to the ED for any worsening or new symptoms.  Olivia Obrien was evaluated in Emergency Department on 01/02/2020 for the symptoms described in the history of present illness. She was evaluated in the context of the global COVID-19 pandemic, which necessitated consideration that the patient might be at risk for infection with the SARS-CoV-2 virus that causes COVID-19. Institutional protocols and algorithms that pertain to the evaluation of patients at risk for COVID-19 are in a state of rapid change based on information released by regulatory bodies including the CDC and federal and state organizations. These policies and algorithms were followed during the patient's care in the ED.   ____________________________________________  FINAL CLINICAL IMPRESSION(S) / ED DIAGNOSES  Final diagnoses:  Gastroenteritis      NEW MEDICATIONS STARTED DURING THIS VISIT:  ED Discharge Orders          Ordered    ondansetron (ZOFRAN) 4 MG tablet  Daily PRN     01/02/20 2048              This chart was dictated using voice recognition software/Dragon. Despite best efforts to proofread, errors can occur which can change the meaning. Any change was purely unintentional.    Enid Derry, PA-C 01/02/20 2250    Willy Eddy, MD 01/03/20 0001

## 2020-08-20 ENCOUNTER — Emergency Department: Payer: Self-pay

## 2020-08-20 ENCOUNTER — Other Ambulatory Visit: Payer: Self-pay

## 2020-08-20 ENCOUNTER — Encounter
Admission: EM | Disposition: A | Discharge status: Home / Self Care | Payer: Self-pay | Source: Home / Self Care | Attending: Emergency Medicine

## 2020-08-20 ENCOUNTER — Emergency Department: Payer: Self-pay | Admitting: Certified Registered"

## 2020-08-20 ENCOUNTER — Ambulatory Visit
Admission: EM | Admit: 2020-08-20 | Discharge: 2020-08-20 | Disposition: A | Discharge status: Home / Self Care | Payer: Self-pay | Attending: Obstetrics and Gynecology | Admitting: Obstetrics and Gynecology

## 2020-08-20 DIAGNOSIS — U071 COVID-19: Secondary | ICD-10-CM | POA: Insufficient documentation

## 2020-08-20 DIAGNOSIS — O009 Unspecified ectopic pregnancy without intrauterine pregnancy: Secondary | ICD-10-CM | POA: Diagnosis present

## 2020-08-20 DIAGNOSIS — Z881 Allergy status to other antibiotic agents status: Secondary | ICD-10-CM | POA: Insufficient documentation

## 2020-08-20 DIAGNOSIS — Z884 Allergy status to anesthetic agent status: Secondary | ICD-10-CM | POA: Insufficient documentation

## 2020-08-20 DIAGNOSIS — O00102 Left tubal pregnancy without intrauterine pregnancy: Secondary | ICD-10-CM | POA: Insufficient documentation

## 2020-08-20 DIAGNOSIS — Y768 Miscellaneous obstetric and gynecological devices associated with adverse incidents, not elsewhere classified: Secondary | ICD-10-CM | POA: Insufficient documentation

## 2020-08-20 DIAGNOSIS — T8332XA Displacement of intrauterine contraceptive device, initial encounter: Secondary | ICD-10-CM | POA: Insufficient documentation

## 2020-08-20 DIAGNOSIS — N939 Abnormal uterine and vaginal bleeding, unspecified: Secondary | ICD-10-CM

## 2020-08-20 HISTORY — PX: DIAGNOSTIC LAPAROSCOPY WITH REMOVAL OF ECTOPIC PREGNANCY: SHX6449

## 2020-08-20 LAB — TROPONIN I (HIGH SENSITIVITY)
Troponin I (High Sensitivity): <2 ng/L (ref <18)
Troponin I (High Sensitivity): <2 ng/L (ref <18)

## 2020-08-20 LAB — COMPREHENSIVE METABOLIC PANEL
ALT: 18 U/L (ref 0–44)
AST: 17 U/L (ref 15–41)
Albumin: 4.5 g/dL (ref 3.5–5.0)
Alkaline Phosphatase: 50 U/L (ref 38–126)
Anion gap: 11 (ref 5–15)
BUN: 15 mg/dL (ref 6–20)
CO2: 25 mmol/L (ref 22–32)
Calcium: 9.2 mg/dL (ref 8.9–10.3)
Chloride: 100 mmol/L (ref 98–111)
Creatinine, Ser: 0.74 mg/dL (ref 0.44–1.00)
GFR, Estimated: >60 mL/min (ref >60)
Glucose, Bld: 96 mg/dL (ref 70–99)
Potassium: 3.5 mmol/L (ref 3.5–5.1)
Sodium: 136 mmol/L (ref 135–145)
Total Bilirubin: 0.6 mg/dL (ref 0.3–1.2)
Total Protein: 7.8 g/dL (ref 6.5–8.1)

## 2020-08-20 LAB — URINALYSIS, COMPLETE (UACMP) WITH MICROSCOPIC
Bacteria, UA: NONE SEEN
Bilirubin Urine: NEGATIVE
Glucose, UA: NEGATIVE mg/dL
Ketones, ur: NEGATIVE mg/dL
Leukocytes,Ua: NEGATIVE
Nitrite: NEGATIVE
Protein, ur: 30 mg/dL — AB
RBC / HPF: >50 RBC/hpf — ABNORMAL HIGH (ref 0–5)
Specific Gravity, Urine: 1.026 (ref 1.005–1.030)
WBC, UA: >50 WBC/hpf — ABNORMAL HIGH (ref 0–5)
pH: 5 (ref 5.0–8.0)

## 2020-08-20 LAB — CBC WITH DIFFERENTIAL/PLATELET
Abs Immature Granulocytes: 0.01 10*3/uL (ref 0.00–0.07)
Basophils Absolute: 0 10*3/uL (ref 0.0–0.1)
Basophils Relative: 0 %
Eosinophils Absolute: 0 10*3/uL (ref 0.0–0.5)
Eosinophils Relative: 0 %
HCT: 43.7 % (ref 36.0–46.0)
Hemoglobin: 14.6 g/dL (ref 12.0–15.0)
Immature Granulocytes: 0 %
Lymphocytes Relative: 18 %
Lymphs Abs: 0.9 10*3/uL (ref 0.7–4.0)
MCH: 31.7 pg (ref 26.0–34.0)
MCHC: 33.4 g/dL (ref 30.0–36.0)
MCV: 94.8 fL (ref 80.0–100.0)
Monocytes Absolute: 0.4 10*3/uL (ref 0.1–1.0)
Monocytes Relative: 7 %
Neutro Abs: 3.8 10*3/uL (ref 1.7–7.7)
Neutrophils Relative %: 75 %
Platelets: 130 10*3/uL — ABNORMAL LOW (ref 150–400)
RBC: 4.61 MIL/uL (ref 3.87–5.11)
RDW: 12.1 % (ref 11.5–15.5)
WBC: 5.1 10*3/uL (ref 4.0–10.5)
nRBC: 0 % (ref 0.0–0.2)

## 2020-08-20 LAB — POC URINE PREG, ED: Preg Test, Ur: POSITIVE — AB

## 2020-08-20 LAB — RESP PANEL BY RT-PCR (RSV, FLU A&B, COVID)  RVPGX2
Influenza A by PCR: NEGATIVE
Influenza B by PCR: NEGATIVE
Resp Syncytial Virus by PCR: NEGATIVE
SARS Coronavirus 2 by RT PCR: POSITIVE — AB

## 2020-08-20 LAB — FIBRIN DERIVATIVES D-DIMER (ARMC ONLY): Fibrin derivatives D-dimer (ARMC): 255.05 ng/mL (FEU) (ref 0.00–499.00)

## 2020-08-20 LAB — TYPE AND SCREEN
ABO/RH(D): A POS
Antibody Screen: NEGATIVE

## 2020-08-20 LAB — HCG, QUANTITATIVE, PREGNANCY: hCG, Beta Chain, Quant, S: 1096 m[IU]/mL — ABNORMAL HIGH (ref <5)

## 2020-08-20 SURGERY — LAPAROSCOPY, WITH ECTOPIC PREGNANCY SURGICAL TREATMENT
Anesthesia: General

## 2020-08-20 MED ORDER — MIDAZOLAM HCL 2 MG/2ML IJ SOLN
INTRAMUSCULAR | Status: AC
  Filled 2020-08-20: qty 2

## 2020-08-20 MED ORDER — LIDOCAINE HCL (CARDIAC) PF 100 MG/5ML IV SOSY
PREFILLED_SYRINGE | INTRAVENOUS | Status: DC | PRN
  Administered 2020-08-20: 25 mg via INTRAVENOUS

## 2020-08-20 MED ORDER — ONDANSETRON HCL 4 MG/2ML IJ SOLN
4.0000 mg | Freq: Once | INTRAMUSCULAR | Status: AC
  Administered 2020-08-20: 4 mg via INTRAVENOUS
  Filled 2020-08-20: qty 2

## 2020-08-20 MED ORDER — PROMETHAZINE HCL 25 MG/ML IJ SOLN
6.2500 mg | INTRAMUSCULAR | Status: DC | PRN
Start: 2020-08-20 — End: 2020-08-21

## 2020-08-20 MED ORDER — ROCURONIUM BROMIDE 100 MG/10ML IV SOLN
INTRAVENOUS | Status: DC | PRN
  Administered 2020-08-20: 5 mg via INTRAVENOUS
  Administered 2020-08-20: 35 mg via INTRAVENOUS

## 2020-08-20 MED ORDER — LACTATED RINGERS IV SOLN
INTRAVENOUS | Status: DC | PRN
Start: 2020-08-20 — End: 2020-08-20

## 2020-08-20 MED ORDER — MIDAZOLAM HCL 2 MG/2ML IJ SOLN
INTRAMUSCULAR | Status: DC | PRN
  Administered 2020-08-20: 2 mg via INTRAVENOUS

## 2020-08-20 MED ORDER — DEXMEDETOMIDINE (PRECEDEX) IN NS 20 MCG/5ML (4 MCG/ML) IV SYRINGE
PREFILLED_SYRINGE | INTRAVENOUS | Status: DC | PRN
  Administered 2020-08-20: 12 ug via INTRAVENOUS

## 2020-08-20 MED ORDER — ONDANSETRON 4 MG PO TBDP: 4.0000 mg | ORAL_TABLET | Freq: Four times a day (QID) | ORAL | Status: DC | PRN

## 2020-08-20 MED ORDER — PRENATAL MULTIVITAMIN CH: 1.0000 | ORAL_TABLET | Freq: Every day | ORAL | Status: DC

## 2020-08-20 MED ORDER — DEXAMETHASONE SODIUM PHOSPHATE 10 MG/ML IJ SOLN
INTRAMUSCULAR | Status: DC | PRN
  Administered 2020-08-20: 10 mg via INTRAVENOUS

## 2020-08-20 MED ORDER — ONDANSETRON HCL 4 MG/2ML IJ SOLN
INTRAMUSCULAR | Status: DC | PRN
  Administered 2020-08-20: 4 mg via INTRAVENOUS

## 2020-08-20 MED ORDER — FENTANYL CITRATE (PF) 100 MCG/2ML IJ SOLN
INTRAMUSCULAR | Status: AC
  Filled 2020-08-20: qty 2

## 2020-08-20 MED ORDER — ONDANSETRON HCL 4 MG/2ML IJ SOLN
INTRAMUSCULAR | Status: AC
  Filled 2020-08-20: qty 2

## 2020-08-20 MED ORDER — OXYCODONE HCL 5 MG PO TABS
ORAL_TABLET | ORAL | Status: AC
  Filled 2020-08-20: qty 1

## 2020-08-20 MED ORDER — SODIUM CHLORIDE 0.9 % IV BOLUS
1000.0000 mL | Freq: Once | INTRAVENOUS | Status: AC
  Administered 2020-08-20: 1000 mL via INTRAVENOUS

## 2020-08-20 MED ORDER — FENTANYL CITRATE (PF) 100 MCG/2ML IJ SOLN
INTRAMUSCULAR | Status: DC | PRN
  Administered 2020-08-20 (×2): 50 ug via INTRAVENOUS

## 2020-08-20 MED ORDER — ROCURONIUM BROMIDE 10 MG/ML (PF) SYRINGE
PREFILLED_SYRINGE | INTRAVENOUS | Status: AC
  Filled 2020-08-20: qty 10

## 2020-08-20 MED ORDER — BUPIVACAINE HCL 0.5 % IJ SOLN
INTRAMUSCULAR | Status: DC | PRN
  Administered 2020-08-20: 8 mL

## 2020-08-20 MED ORDER — SUCCINYLCHOLINE CHLORIDE 200 MG/10ML IV SOSY
PREFILLED_SYRINGE | INTRAVENOUS | Status: AC
  Filled 2020-08-20: qty 10

## 2020-08-20 MED ORDER — PROPOFOL 10 MG/ML IV BOLUS
INTRAVENOUS | Status: DC | PRN
  Administered 2020-08-20: 150 mg via INTRAVENOUS

## 2020-08-20 MED ORDER — OXYCODONE HCL 5 MG PO TABS
5.0000 mg | ORAL_TABLET | Freq: Once | ORAL | Status: AC | PRN
  Administered 2020-08-20: 5 mg via ORAL

## 2020-08-20 MED ORDER — PROPOFOL 10 MG/ML IV BOLUS
INTRAVENOUS | Status: AC
  Filled 2020-08-20: qty 20

## 2020-08-20 MED ORDER — FENTANYL CITRATE (PF) 100 MCG/2ML IJ SOLN: 25.0000 ug | INTRAMUSCULAR | Status: DC | PRN

## 2020-08-20 MED ORDER — OXYCODONE HCL 5 MG/5ML PO SOLN: 5.0000 mg | Freq: Once | ORAL | Status: AC | PRN

## 2020-08-20 MED ORDER — SUGAMMADEX SODIUM 200 MG/2ML IV SOLN
INTRAVENOUS | Status: DC | PRN
  Administered 2020-08-20: 200 mg via INTRAVENOUS

## 2020-08-20 MED ORDER — KETOROLAC TROMETHAMINE 30 MG/ML IJ SOLN
INTRAMUSCULAR | Status: AC
  Filled 2020-08-20: qty 1

## 2020-08-20 MED ORDER — KETOROLAC TROMETHAMINE 30 MG/ML IJ SOLN
INTRAMUSCULAR | Status: DC | PRN
  Administered 2020-08-20: 30 mg via INTRAVENOUS

## 2020-08-20 MED ORDER — SUCCINYLCHOLINE CHLORIDE 20 MG/ML IJ SOLN
INTRAMUSCULAR | Status: DC | PRN
  Administered 2020-08-20: 100 mg via INTRAVENOUS

## 2020-08-20 MED ORDER — LIDOCAINE HCL (PF) 2 % IJ SOLN
INTRAMUSCULAR | Status: AC
  Filled 2020-08-20: qty 5

## 2020-08-20 MED ORDER — DEXMEDETOMIDINE (PRECEDEX) IN NS 20 MCG/5ML (4 MCG/ML) IV SYRINGE
PREFILLED_SYRINGE | INTRAVENOUS | Status: AC
  Filled 2020-08-20: qty 5

## 2020-08-20 MED ORDER — OXYCODONE-ACETAMINOPHEN 5-325 MG PO TABS: 1.0000 | ORAL_TABLET | ORAL | Status: DC | PRN

## 2020-08-20 MED ORDER — GLYCOPYRROLATE 0.2 MG/ML IJ SOLN
INTRAMUSCULAR | Status: DC | PRN
  Administered 2020-08-20: .2 mg via INTRAVENOUS

## 2020-08-20 MED ORDER — DEXAMETHASONE SODIUM PHOSPHATE 10 MG/ML IJ SOLN
INTRAMUSCULAR | Status: AC
  Filled 2020-08-20: qty 1

## 2020-08-20 SURGICAL SUPPLY — 41 items
BAG URINE DRAIN 2000ML AR STRL (UROLOGICAL SUPPLIES) ×3 IMPLANT
BLADE SURG SZ11 CARB STEEL (BLADE) ×3 IMPLANT
CANISTER SUCT 1200ML W/VALVE (MISCELLANEOUS) ×3 IMPLANT
CATH ROBINSON RED A/P 16FR (CATHETERS) ×3 IMPLANT
CHLORAPREP W/TINT 26 (MISCELLANEOUS) ×3 IMPLANT
CLOSURE WOUND 1/4X4 (GAUZE/BANDAGES/DRESSINGS) ×1
COVER WAND RF STERILE (DRAPES) ×3 IMPLANT
DERMABOND ADVANCED (GAUZE/BANDAGES/DRESSINGS) ×2
DERMABOND ADVANCED .7 DNX12 (GAUZE/BANDAGES/DRESSINGS) ×1 IMPLANT
DRSG TEGADERM 2-3/8X2-3/4 SM (GAUZE/BANDAGES/DRESSINGS) ×9 IMPLANT
GLOVE SURG SYN 8.0 (GLOVE) ×3 IMPLANT
GOWN STRL REUS W/ TWL LRG LVL3 (GOWN DISPOSABLE) ×2 IMPLANT
GOWN STRL REUS W/ TWL XL LVL3 (GOWN DISPOSABLE) ×1 IMPLANT
GOWN STRL REUS W/TWL LRG LVL3 (GOWN DISPOSABLE) ×4
GOWN STRL REUS W/TWL XL LVL3 (GOWN DISPOSABLE) ×2
GRASPER SUT TROCAR 14GX15 (MISCELLANEOUS) ×3 IMPLANT
IRRIGATION STRYKERFLOW (MISCELLANEOUS) ×1 IMPLANT
IRRIGATOR STRYKERFLOW (MISCELLANEOUS) ×3
IV NS 1000ML (IV SOLUTION) ×2
IV NS 1000ML BAXH (IV SOLUTION) ×1 IMPLANT
KIT TURNOVER CYSTO (KITS) ×3 IMPLANT
LABEL OR SOLS (LABEL) ×3 IMPLANT
MANIFOLD NEPTUNE II (INSTRUMENTS) ×3 IMPLANT
NS IRRIG 500ML POUR BTL (IV SOLUTION) ×3 IMPLANT
PACK GYN LAPAROSCOPIC (MISCELLANEOUS) ×3 IMPLANT
PAD OB MATERNITY 4.3X12.25 (PERSONAL CARE ITEMS) ×3 IMPLANT
PAD PREP 24X41 OB/GYN DISP (PERSONAL CARE ITEMS) ×3 IMPLANT
POUCH SPECIMEN RETRIEVAL 10MM (ENDOMECHANICALS) ×3 IMPLANT
SET TUBE SMOKE EVAC HIGH FLOW (TUBING) ×3 IMPLANT
SHEARS HARMONIC ACE PLUS 36CM (ENDOMECHANICALS) ×3 IMPLANT
SLEEVE ENDOPATH XCEL 5M (ENDOMECHANICALS) ×3 IMPLANT
SPONGE GAUZE 2X2 8PLY STER LF (GAUZE/BANDAGES/DRESSINGS) ×3
SPONGE GAUZE 2X2 8PLY STRL LF (GAUZE/BANDAGES/DRESSINGS) ×6 IMPLANT
STRIP CLOSURE SKIN 1/4X4 (GAUZE/BANDAGES/DRESSINGS) ×2 IMPLANT
SUT VIC AB 0 CT1 36 (SUTURE) ×3 IMPLANT
SUT VIC AB 2-0 UR6 27 (SUTURE) ×3 IMPLANT
SUT VIC AB 4-0 SH 27 (SUTURE) ×2
SUT VIC AB 4-0 SH 27XANBCTRL (SUTURE) ×1 IMPLANT
SWABSTK COMLB BENZOIN TINCTURE (MISCELLANEOUS) ×3 IMPLANT
TROCAR ENDO BLADELESS 11MM (ENDOMECHANICALS) ×3 IMPLANT
TROCAR XCEL NON-BLD 5MMX100MML (ENDOMECHANICALS) ×3 IMPLANT

## 2020-08-20 NOTE — Anesthesia Procedure Notes (Signed)
Procedure Name: Intubation Performed by: Mathews Argyle, CRNA Pre-anesthesia Checklist: Patient identified, Patient being monitored, Timeout performed, Emergency Drugs available and Suction available Patient Re-evaluated:Patient Re-evaluated prior to induction Oxygen Delivery Method: Circle system utilized Preoxygenation: Pre-oxygenation with 100% oxygen Induction Type: IV induction and Rapid sequence Ventilation: Mask ventilation without difficulty Laryngoscope Size: 3 and McGraph Grade View: Grade I Tube type: Oral Tube size: 6.5 mm Number of attempts: 1 Airway Equipment and Method: Stylet and Video-laryngoscopy Placement Confirmation: ETT inserted through vocal cords under direct vision,  positive ETCO2 and breath sounds checked- equal and bilateral Secured at: 20 cm Tube secured with: Tape Dental Injury: Teeth and Oropharynx as per pre-operative assessment

## 2020-08-20 NOTE — ED Triage Notes (Signed)
Pt to the er for symptoms associated with covid. Pt tested positive on Tuesday. Some SOB, cough, itching in her eyes, seeing spots in her eyes. Pt states she has been drinking water and eating. Pt reports fever intermittent.

## 2020-08-20 NOTE — ED Provider Notes (Signed)
[facility] Emergency Department Provider Note  ___________________________________________   Event Date/Time   First MD Initiated Contact with Patient 08/20/20 1605     (approximate)  I have reviewed the triage vital signs and the nursing notes.   HISTORY  Chief Complaint Chest Pain  HPI Olivia Obrien is a 29 y.o. female who reports to the emergency department for evaluation of chest pain.  She reports she became symptomatic of Covid symptoms on this past Monday, 08/16/2020 and sought evaluation on Tuesday and tested positive for Covid.  She states that she was doing well with loss of taste, headache and generalized flulike symptoms until yesterday when she began having chest pain.  She states that this progressed from being intermittent to constant today and is worse with deep inspiration.  She denies any palpitations at this time.  She does report one episode of vomiting in our waiting room, but states that is her only episode but that she has been recently nauseous.  She also reports dysuria, and reports that it hurts into her chest when she urinates.  She states that she is currently on her menstrual cycle and has an IUD in place.  She also reports intermittent left-sided abdominal pain worse with the urination and cough.  She denies any fevers.         Past Medical History:  Diagnosis Date  . History of pre-eclampsia in prior pregnancy, currently pregnant   . Hx of chlamydia infection   . Medical history non-contributory     Patient Active Problem List   Diagnosis Date Noted  . Encounter for induction of labor 06/11/2018  . Supervision of other normal pregnancy, antepartum 10/11/2017    Past Surgical History:  Procedure Laterality Date  . WISDOM TOOTH EXTRACTION      Prior to Admission medications   Medication Sig Start Date End Date Taking? Authorizing Provider  ibuprofen (ADVIL,MOTRIN) 600 MG tablet Take 1 tablet (600 mg total) by mouth  every 6 (six) hours. 06/12/18   Marcelle Overlie, MD  ondansetron (ZOFRAN) 4 MG tablet Take 1 tablet (4 mg total) by mouth daily as needed for nausea or vomiting. 01/02/20 01/01/21  Enid Derry, PA-C  predniSONE (DELTASONE) 50 MG tablet Take 1 tablet (50 mg total) by mouth daily with breakfast. 07/16/18   Cuthriell, Delorise Royals, PA-C    Allergies Mupirocin, Other, Bee venom, and Dermoplast [benzocaine]  Family History  Problem Relation Age of Onset  . Depression Mother   . Hypertension Mother   . Heart disease Father        clot in heart  . Seizures Father   . Tuberculosis Father   . Depression Father   . Heart disease Maternal Grandfather        transplant  . Diabetes Paternal Grandfather     Social History Social History   Tobacco Use  . Smoking status: Never Smoker  . Smokeless tobacco: Never Used  . Tobacco comment: Occasionally 1 cigarette / week  Vaping Use  . Vaping Use: Never used  Substance Use Topics  . Alcohol use: Yes  . Drug use: No    Review of Systems Constitutional: No fever/chills Eyes: No visual changes. ENT: No sore throat. Cardiovascular: + chest pain. Respiratory: Denies shortness of breath. Gastrointestinal: + abdominal pain.  + nausea, + vomiting.  No diarrhea.  No constipation. Genitourinary: + for dysuria. Musculoskeletal: Negative for back pain. Skin: Negative for rash. Neurological: + headaches, negative for focal weakness or numbness.  ____________________________________________   PHYSICAL EXAM:  VITAL SIGNS: ED Triage Vitals  Enc Vitals Group     BP 08/20/20 1416 119/81     Pulse Rate 08/20/20 1411 81     Resp 08/20/20 1411 19     Temp 08/20/20 1411 98.5 F (36.9 C)     Temp Source 08/20/20 1411 Oral     SpO2 08/20/20 1411 97 %     Weight 08/20/20 1411 127 lb (57.6 kg)     Height 08/20/20 1411 5\' 1"  (1.549 m)     Head Circumference --      Peak Flow --      Pain Score 08/20/20 1413 9     Pain Loc --      Pain Edu? --       Excl. in GC? --    Constitutional: Alert and oriented. Well appearing and in no acute distress. Eyes: Conjunctivae are normal. PERRL. EOMI. Head: Atraumatic. Nose: No congestion/rhinnorhea. Ears: The bilateral TMs are visualized, pearly gray with no erythematous injection or bulging. Mouth/Throat: Mucous membranes are moist.  Oropharynx non-erythematous. Neck: No stridor.   Lymphatic: Some mild left-sided anterior cervical lymphadenopathy. Cardiovascular: Normal rate, regular rhythm. Grossly normal heart sounds.  Good peripheral circulation. Respiratory: Normal respiratory effort.  No retractions. Lungs CTAB. Gastrointestinal: Tender to palpation of the left lower quadrant with mild guarding. No distention.  Positive CVA tenderness, worse on left than right. Musculoskeletal: No lower extremity tenderness nor edema.  No joint effusions. Neurologic:  Normal speech and language. No gross focal neurologic deficits are appreciated. No gait instability. Skin:  Skin is warm, dry and intact. No rash noted. Psychiatric: Mood and affect are normal. Speech and behavior are normal.  ____________________________________________   LABS (all labs ordered are listed, but only abnormal results are displayed)  Labs Reviewed  RESP PANEL BY RT-PCR (RSV, FLU A&B, COVID)  RVPGX2 - Abnormal; Notable for the following components:      Result Value   SARS Coronavirus 2 by RT PCR POSITIVE (*)    All other components within normal limits  CBC WITH DIFFERENTIAL/PLATELET - Abnormal; Notable for the following components:   Platelets 130 (*)    All other components within normal limits  URINALYSIS, COMPLETE (UACMP) WITH MICROSCOPIC - Abnormal; Notable for the following components:   Color, Urine YELLOW (*)    APPearance CLOUDY (*)    Hgb urine dipstick LARGE (*)    Protein, ur 30 (*)    RBC / HPF >50 (*)    WBC, UA >50 (*)    All other components within normal limits  HCG, QUANTITATIVE, PREGNANCY -  Abnormal; Notable for the following components:   hCG, Beta Chain, Quant, S 1,096 (*)    All other components within normal limits  POC URINE PREG, ED - Abnormal; Notable for the following components:   Preg Test, Ur POSITIVE (*)    All other components within normal limits  COMPREHENSIVE METABOLIC PANEL  FIBRIN DERIVATIVES D-DIMER ([facility] ONLY)  TYPE AND SCREEN  TROPONIN I (HIGH SENSITIVITY)  TROPONIN I (HIGH SENSITIVITY)   ____________________________________________  EKG  Normal sinus rhythm with a rate of 94 bpm, normal QT interval at 340 ms.  No ST-T wave changes or inversions.  No signs of acute ischemia. ____________________________________________  RADIOLOGY I, Lucy Chrisaitlin J Finas Delone, personally viewed and evaluated these images (plain radiographs) as part of my medical decision making, as well as reviewing the written report by the radiologist.  ED provider interpretation: No acute  pneumonia or other findings.  Official radiology report(s): DG Chest Portable 1 View  Result Date: 08/20/2020 CLINICAL DATA:  Chest pain EXAM: PORTABLE CHEST 1 VIEW COMPARISON:  None. FINDINGS: The heart size and mediastinal contours are within normal limits. Both lungs are clear. The visualized skeletal structures are unremarkable. IMPRESSION: No active disease. Electronically Signed   By: Jasmine Pang M.D.   On: 08/20/2020 17:04   US OB LESS THAN 14 WEEKS WITH OB TRANSVAGINAL  Result Date: 08/20/2020 CLINICAL DATA:  Pregnancy, IUD, left lower quadrant pain and guarding EXAM: OBSTETRIC <14 WK ULTRASOUND TECHNIQUE: Transabdominal ultrasound was performed for evaluation of the gestation as well as the maternal uterus and adnexal regions. COMPARISON:  None. FINDINGS: Intrauterine gestational sac: None Yolk sac:  Not Visualized. Embryo:  Not Visualized. Cardiac Activity: Not Visualized. Heart Rate: Not applicable. There is a candidate left adnexal ectopic pregnancy without fetal pole or yolk sac. MSD:  5.3  mm   5 w   2 d Subchorionic hemorrhage:  None visualized. Maternal uterus/adnexae: Normal appearance of the ovaries. Candidate left adnexal ectopic pregnancy as above. Low lying position of the IUD, which is poorly visualized due to location and orientation. IMPRESSION: 1. No intrauterine pregnancy identified. Early pregnancy of unknown location. 2. There is a candidate left adnexal ectopic pregnancy, which does not however demonstrate a fetal pole or other internal structure and appears to be distinct from the left ovary. This is suspicious for ectopic pregnancy although remains equivocal. Gestational age by MSD is approximately 5 weeks, 2 days. Recommend close clinical and imaging follow-up. 3. Malpositioned, low lying IUD, poorly visualized due to low lying location and orientation. Findings discussed by telephone with Dr. Page Spiro at 7:35 p.m., 08/20/2020. Electronically Signed   By: Lauralyn Primes M.D.   On: 08/20/2020 19:42   ____________________________________________   INITIAL IMPRESSION / ASSESSMENT AND PLAN / ED COURSE  As part of my medical decision making, I reviewed the following data within the electronic MEDICAL RECORD NUMBER Nursing notes reviewed and incorporated, Labs reviewed, Radiograph reviewed and A consult was requested and obtained from this/these consultant(s) OB/GYN        Patient is a 29 year old female who reports to the emergency department with primary chief complaint of chest pain in the setting of being Covid positive for the last 4 days.  The patient states that the chest pain began last night and worsened today.  In regards to her chest pain, work-up included a CBC, CMP, troponin, chest x-ray and respiratory panel as well as D-dimer.  CBC and CMP are grossly unremarkable.  D-dimer is not elevated.  The patient's respiratory panel is positive for Covid but negative for flu or RSV.  Troponins are both less than 2.  Chest x-ray does not show any acute pneumonia.  Physical  exam does not reveal any abnormal auscultation and patient is oxygenating well without tachycardia or tachypnea.  In regards to the patient's chest pain, feel this is most likely representative atypical chest pain in the setting of Covid.  In work-up for the patient's dysuria, a urinalysis and POC pregnancy test were obtained.  Was positive for pregnancy.  The patient reports she has had a Mirena IUD in place for the last 2 years.  She states that her menstrual cycles are typically at the beginning of the month, but that this 1 was slightly late.  Last menstrual period began approximately 6 weeks ago but she is currently bleeding and thought this to be her  period.  Beta hCG was obtained and was approximately 1100.  The patient does have some left lower quadrant pain with mild guarding as well as bilateral CVA tenderness, worse on the left than the right.  Urinalysis demonstrates greater than 50 white cells, greater than 50 red cells and a large amount hemoglobin without any bacteria, leukocytes or nitrites.  Pelvic ultrasound was ordered to evaluate for ectopic given patient's left lower quadrant pain, pregnancy status.  Ultrasound does reveal a malpositioned IUD without any intrauterine pregnancy.  There is suspicion for left adnexal ectopic given a small amount of fluid that is adjacent to left ovary.  Dr. Feliberto Gottron of OB/GYN was consulted on the case.  He will take her to the OR for treatment of suspected ectopic.  The patient was instructed to follow-up with Carren Rang, PA-C in regards to her Covid symptoms.  Follow OBs recommendation for follow-up status post ectopic treatment.  The patient is amenable with this plan.         ____________________________________________   FINAL CLINICAL IMPRESSION(S) / ED DIAGNOSES  Final diagnoses:  Ectopic pregnancy, unspecified location, unspecified whether intrauterine pregnancy present  COVID     ED Discharge Orders    None      *Please  note:  Olivia Obrien was evaluated in Emergency Department on 08/20/2020 for the symptoms described in the history of present illness. She was evaluated in the context of the global COVID-19 pandemic, which necessitated consideration that the patient might be at risk for infection with the SARS-CoV-2 virus that causes COVID-19. Institutional protocols and algorithms that pertain to the evaluation of patients at risk for COVID-19 are in a state of rapid change based on information released by regulatory bodies including the CDC and federal and state organizations. These policies and algorithms were followed during the patient's care in the ED.  Some ED evaluations and interventions may be delayed as a result of limited staffing during and the pandemic.*   Note:  This document was prepared using Dragon voice recognition software and may include unintentional dictation errors.    Lucy Chris, PA 08/20/20 2004    Sharman Cheek, MD 08/21/20 Paulo Fruit

## 2020-08-20 NOTE — Transfer of Care (Signed)
Immediate Anesthesia Transfer of Care Note  Patient: Olivia Obrien  Procedure(s) Performed: DIAGNOSTIC LAPAROSCOPY WITH REMOVAL OF ECTOPIC PREGNANCY (N/A )  Patient Location: COVID RECOVERY IN OR  Anesthesia Type:General  Level of Consciousness: awake, alert  and oriented  Airway & Oxygen Therapy: Patient Spontanous Breathing and Patient connected to face mask oxygen  Post-op Assessment: Report given to RN and Post -op Vital signs reviewed and stable  Post vital signs: Reviewed  Last Vitals:  Vitals Value Taken Time  BP 129/88 08/20/20 2303  Temp 36 C 08/20/20 2303  Pulse 115 08/20/20 2303  Resp 18 08/20/20 2303  SpO2 97 % 08/20/20 2303    Last Pain:  Vitals:   08/20/20 1416  TempSrc: Oral  PainSc:          Complications: No complications documented.

## 2020-08-20 NOTE — Anesthesia Preprocedure Evaluation (Addendum)
Anesthesia Evaluation  Patient identified by MRN, date of birth, ID band Patient awake    Reviewed: Allergy & Precautions, H&P , NPO status , Patient's Chart, lab work & pertinent test results  History of Anesthesia Complications Negative for: history of anesthetic complications  Airway Mallampati: I  TM Distance: >3 FB Neck ROM: full    Dental  (+) Teeth Intact   Pulmonary neg sleep apnea, neg COPD,  Covid-19 positive Denies cough or fever   breath sounds clear to auscultation       Cardiovascular (-) angina(-) Past MI and (-) Cardiac Stents negative cardio ROS  (-) dysrhythmias  Rhythm:regular Rate:Normal     Neuro/Psych negative neurological ROS  negative psych ROS   GI/Hepatic negative GI ROS, Neg liver ROS,   Endo/Other  negative endocrine ROS  Renal/GU      Musculoskeletal   Abdominal   Peds  Hematology negative hematology ROS (+)   Anesthesia Other Findings Past Medical History: No date: History of pre-eclampsia in prior pregnancy, currently  pregnant No date: Hx of chlamydia infection No date: Medical history non-contributory  Past Surgical History: No date: WISDOM TOOTH EXTRACTION  BMI    Body Mass Index: 24.00 kg/m      Reproductive/Obstetrics negative OB ROS                            Anesthesia Physical Anesthesia Plan  ASA: II  Anesthesia Plan: General ETT and Modified Rapid Sequence   Post-op Pain Management:    Induction:   PONV Risk Score and Plan: Ondansetron, Dexamethasone, Midazolam and Treatment may vary due to age or medical condition  Airway Management Planned:   Additional Equipment:   Intra-op Plan:   Post-operative Plan:   Informed Consent: I have reviewed the patients History and Physical, chart, labs and discussed the procedure including the risks, benefits and alternatives for the proposed anesthesia with the patient or authorized  representative who has indicated his/her understanding and acceptance.     Dental Advisory Given  Plan Discussed with: Anesthesiologist, CRNA and Surgeon  Anesthesia Plan Comments:        Anesthesia Quick Evaluation

## 2020-08-20 NOTE — ED Notes (Signed)
Pt in gown. Pt belongings in belonging bags. Consent forms signed and at bedside

## 2020-08-20 NOTE — ED Notes (Signed)
Pt's cell phone placed in belonging bags with labels

## 2020-08-20 NOTE — Consult Note (Signed)
Consult History and Physical   SERVICE: Gynecology [facility]   Patient Name: Olivia Obrien Patient MRN:   [id number]  CC: chest pain and LLQ pain   HPI: Olivia Obrien is a 29 y.o. [id number] with 2 day h/o chest pain and LLQ pain . + recent covid infection Tuesday. LLQ pain worsening now 8+/ 10  + HCG and quant =1096 U/s tonight : see below    Review of Systems: positives in bold GEN:   fevers, chills, weight changes, appetite changes, fatigue, night sweats HEENT:  HA, vision changes, hearing loss, congestion, rhinorrhea, sinus pressure, dysphagia CV:  + CP, no palpitations PULM:  SOB, cough GI:  abd pain, N/V/D/C GU:  dysuria, urgency, frequency, + LLQ pain  MSK:  arthralgias, myalgias, back pain, swelling SKIN:  rashes, color changes, pallor NEURO:  numbness, weakness, tingling, seizures, dizziness, tremors PSYCH:  depression, anxiety, behavioral problems, confusion  HEME/LYMPH:  easy bruising or bleeding ENDO:  heat/cold intolerance  Past Obstetrical History: OB History    Gravida  2   Para  2   Term  2   Preterm      AB      Living  2     SAB      IAB      Ectopic      Multiple  0   Live Births  2           Past Gynecologic History: LMP 07/12/20- EGA 5+4  Past Medical History: Past Medical History:  Diagnosis Date  . History of pre-eclampsia in prior pregnancy, currently pregnant   . Hx of chlamydia infection   . Medical history non-contributory     Past Surgical History:   Past Surgical History:  Procedure Laterality Date  . WISDOM TOOTH EXTRACTION      Family History:  family history includes Depression in her father and mother; Diabetes in her paternal grandfather; Heart disease in her father and maternal grandfather; Hypertension in her mother; Seizures in her father; Tuberculosis in her father.  Social History:  Social History   Socioeconomic History  . Marital status: Single    Spouse name: Not on file  . Number of  children: Not on file  . Years of education: Not on file  . Highest education level: Not on file  Occupational History  . Not on file  Tobacco Use  . Smoking status: Never Smoker  . Smokeless tobacco: Never Used  . Tobacco comment: Occasionally 1 cigarette / week  Vaping Use  . Vaping Use: Never used  Substance and Sexual Activity  . Alcohol use: Yes  . Drug use: No  . Sexual activity: Not Currently  Other Topics Concern  . Not on file  Social History Narrative  . Not on file   Social Determinants of Health   Financial Resource Strain: Not on file  Food Insecurity: Not on file  Transportation Needs: Not on file  Physical Activity: Not on file  Stress: Not on file  Social Connections: Not on file  Intimate Partner Violence: Not on file    Home Medications:  Medications reconciled in EPIC  No current facility-administered medications on file prior to encounter.   Current Outpatient Medications on File Prior to Encounter  Medication Sig Dispense Refill  . ibuprofen (ADVIL,MOTRIN) 600 MG tablet Take 1 tablet (600 mg total) by mouth every 6 (six) hours. 30 tablet 0  . ondansetron (ZOFRAN) 4 MG tablet Take 1 tablet (4 mg total) by mouth  daily as needed for nausea or vomiting. 10 tablet 0  . predniSONE (DELTASONE) 50 MG tablet Take 1 tablet (50 mg total) by mouth daily with breakfast. 5 tablet 0    Allergies:  Allergies  Allergen Reactions  . Mupirocin Itching, Rash and Other (See Comments)    Burning of the skin  . Other Shortness Of Breath, Itching, Swelling and Rash    Hair dye- caused severe reactions  . Bee Venom Swelling    At the site of sting  . Dermoplast [Benzocaine] Rash    Physical Exam:  Temp:  [98.5 F (36.9 C)] 98.5 F (36.9 C) (12/10 1416) Pulse Rate:  [73-101] 77 (12/10 1921) Resp:  [10-22] 13 (12/10 1921) BP: (115-126)/(81-95) 115/85 (12/10 1921) SpO2:  [97 %-100 %] 97 % (12/10 1921) Weight:  [57.6 kg] 57.6 kg (12/10 1411)   General  Appearance:  Well developed, well nourished, no acute distress, alert and oriented x3 HEENT:  Normocephalic atraumatic, extraocular movements intact, moist mucous membranes Cardiovascular:  Normal S1/S2, regular rate and rhythm, no murmurs Pulmonary:  clear to auscultation, no wheezes, rales or rhonchi, symmetric air entry, good air exchange Abdomen:  Bowel sounds present, soft, + LLQ pain mild rebound  Extremities:  Full range of motion, no pedal edema, 2+ distal pulses, no tenderness Skin:  normal coloration and turgor, no rashes, no suspicious skin lesions noted  Neurologic:  Cranial nerves 2-12 grossly intact, normal muscle tone, strength 5/5 all four extremities Psychiatric:  Normal mood and affect, appropriate, no AH/VH Pelvic:  deferred Labs/Studies:   CBC and Coags:  Lab Results  Component Value Date   WBC 5.1 08/20/2020   NEUTOPHILPCT 75 08/20/2020   EOSPCT 0 08/20/2020   BASOPCT 0 08/20/2020   LYMPHOPCT 18 08/20/2020   HGB 14.6 08/20/2020   HCT 43.7 08/20/2020   MCV 94.8 08/20/2020   PLT 130 (L) 08/20/2020   CMP:  Lab Results  Component Value Date   NA 136 08/20/2020   K 3.5 08/20/2020   CL 100 08/20/2020   CO2 25 08/20/2020   BUN 15 08/20/2020   CREATININE 0.74 08/20/2020   CREATININE 0.75 01/02/2020   CREATININE 0.51 04/28/2018   PROT 7.8 08/20/2020   BILITOT 0.6 08/20/2020   ALT 18 08/20/2020   AST 17 08/20/2020   ALKPHOS 50 08/20/2020    Other Imaging: DG Chest Portable 1 View  Result Date: 08/20/2020 CLINICAL DATA:  Chest pain EXAM: PORTABLE CHEST 1 VIEW COMPARISON:  None. FINDINGS: The heart size and mediastinal contours are within normal limits. Both lungs are clear. The visualized skeletal structures are unremarkable. IMPRESSION: No active disease. Electronically Signed   By: Jasmine Pang M.D.   On: 08/20/2020 17:04   US OB LESS THAN 14 WEEKS WITH OB TRANSVAGINAL  Result Date: 08/20/2020 CLINICAL DATA:  Pregnancy, IUD, left lower quadrant pain  and guarding EXAM: OBSTETRIC <14 WK ULTRASOUND TECHNIQUE: Transabdominal ultrasound was performed for evaluation of the gestation as well as the maternal uterus and adnexal regions. COMPARISON:  None. FINDINGS: Intrauterine gestational sac: None Yolk sac:  Not Visualized. Embryo:  Not Visualized. Cardiac Activity: Not Visualized. Heart Rate: Not applicable. There is a candidate left adnexal ectopic pregnancy without fetal pole or yolk sac. MSD:  5.3 mm   5 w   2 d Subchorionic hemorrhage:  None visualized. Maternal uterus/adnexae: Normal appearance of the ovaries. Candidate left adnexal ectopic pregnancy as above. Low lying position of the IUD, which is poorly visualized due to location and  orientation. IMPRESSION: 1. No intrauterine pregnancy identified. Early pregnancy of unknown location. 2. There is a candidate left adnexal ectopic pregnancy, which does not however demonstrate a fetal pole or other internal structure and appears to be distinct from the left ovary. This is suspicious for ectopic pregnancy although remains equivocal. Gestational age by MSD is approximately 5 weeks, 2 days. Recommend close clinical and imaging follow-up. 3. Malpositioned, low lying IUD, poorly visualized due to low lying location and orientation. Findings discussed by telephone with Dr. Page Spiro at 7:35 p.m., 08/20/2020. Electronically Signed   By: Lauralyn Primes M.D.   On: 08/20/2020 19:42     Assessment / Plan:   Olivia Obrien is a 29 y.o. [id number] who presents with LLQ pain and probable left ectopic pregnancy  Malpositioned IUD   Blood type pending   Not a candidate for MTX given symptoms  I have counseled regarding the need for L/S surgery for probable removal of left fallopian tube  Removal of IUD  Risks explained and the patient has signed consent forms   Unassigned patient   Thank you for the opportunity to be involved with this pt's care.

## 2020-08-20 NOTE — Brief Op Note (Signed)
08/20/2020  10:51 PM  PATIENT:  Olivia Obrien  29 y.o. female  PRE-OPERATIVE DIAGNOSIS:  Ectopic pregnancy, left   POST-OPERATIVE DIAGNOSIS:  Ectopic pregnancy, left fallopian tube   PROCEDURE:  Procedure(s): DIAGNOSTIC LAPAROSCOPY WITH REMOVAL OF ECTOPIC PREGNANCY (N/A) L/s left salpingectomy  SURGEON:  Surgeon(s) and Role:    * Genny Caulder, Ihor Austin, MD - Primary  PHYSICIAN ASSISTANT:cst   ASSISTANTS: none   ANESTHESIA:   general  EBL:  50 mL iof 700 cc ou 100 cc  BLOOD ADMINISTERED:none  DRAINS: none   LOCAL MEDICATIONS USED:  MARCAINE     SPECIMEN:  Source of Specimen:  left fallopian tube with ectopic   DISPOSITION OF SPECIMEN:  PATHOLOGY  COUNTS:  YES  TOURNIQUET:  * No tourniquets in log *  DICTATION: .Other Dictation: Dictation Number verbal  PLAN OF CARE: Discharge to home after PACU  PATIENT DISPOSITION:  PACU - hemodynamically stable.   Delay start of Pharmacological VTE agent (>24hrs) due to surgical blood loss or risk of bleeding: yes

## 2020-08-20 NOTE — Discharge Instructions (Signed)
AMBULATORY SURGERY  DISCHARGE INSTRUCTIONS   1) The drugs that you were given will stay in your system until tomorrow so for the next 24 hours you should not:  A) Drive an automobile B) Make any legal decisions C) Drink any alcoholic beverage   2) You may resume regular meals tomorrow.  Today it is better to start with liquids and gradually work up to solid foods.  You may eat anything you prefer, but it is better to start with liquids, then soup and crackers, and gradually work up to solid foods.   3) Please notify your doctor immediately if you have any unusual bleeding, trouble breathing, redness and pain at the surgery site, drainage, fever, or pain not relieved by medication.    4) Additional Instructions:        Please contact your physician with any problems or Same Day Surgery at [phone], Monday through Friday 6 am to 4 pm, or [place] at [facility] number at [phone].Please follow-up with your primary care regarding Covid and any problems from this.  Please follow-up with OB/GYN's recommendations regarding care for the ectopic pregnancy.  Return to the emergency department with any acute worsening.

## 2020-08-21 NOTE — OR Nursing (Signed)
Patient Covid positive, discharged via W/C  from OR 9 after Ephriam Knuckles, CRNA did recovery.

## 2020-08-21 NOTE — Op Note (Signed)
NAME: Olivia Obrien, Olivia Obrien. MEDICAL RECORD HB:[id number] ACCOUNT [id number] DATE OF BIRTH:1990/11/20 FACILITY: [facility] LOCATION: [facility] PHYSICIAN:Ernestine Langworthy Cloyde Reams, MD  OPERATIVE REPORT  DATE OF PROCEDURE:  08/20/2020  PREOPERATIVE DIAGNOSIS:  Left ectopic tubal pregnancy.  POSTOPERATIVE DIAGNOSIS:  Left ectopic tubal pregnancy.  PROCEDURE: 1.  Laparoscopic left salpingectomy. 2.  Removal of IUD.  SURGEON:  Jennell Corner, MD  ANESTHESIA:  General endotracheal anesthesia.  INDICATIONS:  A 29 year old gravida 3, para 2 patient with a 2-day history of left lower quadrant pain with a quantitative hCG of 1094.  Ultrasound in the Emergency Department demonstrated a left adnexal mass consistent with ectopic pregnancy.  DESCRIPTION OF PROCEDURE:  After adequate general endotracheal anesthesia, the patient was placed in dorsal supine position with the legs in the [place] stirrups.  Abdominoperineal and vaginal prep was performed.  The patient was sterilely draped.  Timeout  was performed.  A straight catheterization of the bladder yielded 100 mL of clear urine.  A single tooth tenaculum was placed on the anterior cervix and a ring forcep was used to remove the IUD . A Kahn cannula was placed in the endocervical canal to be used for uterine manipulation during the  procedure.  Gloves and gown were changed.  Attention was directed to the patient's abdomen where the 5 mm infraumbilical incision was made after injecting with Marcaine.  The laparoscope was advanced into the abdominal cavity with the Optiview cannula.   The patient's abdomen was insufflated with carbon dioxide.  Second port site was placed in the left lower quadrant 3 cm medial to the left anterior iliac spine.  An 11 mm trocar was advanced under direct visualization.  A third trocar was placed in the  right lower quadrant, again 3 cm medial to the right anterior iliac spine and under direct visualization, the trocar was  advanced.  Initial impression revealed free blood in the posterior cul-de-sac and anterior cul-de-sac.  Left fallopian tube was  dilated at the mid and distal portion consistent with a left ectopic pregnancy.  Harmonic scalpel was brought up to the operative field.  The distal portion of the left fallopian tube was grasped with a grasper and the left fallopian tube was dissected  free with a Kleppinger cautery and Harmonic scalpel.  Good hemostasis was noted.  The patient's abdomen was irrigated and suctioned.  Intraperitoneal pressure was lowered to 5 mmHg and good hemostasis was noted.  The upper abdomen appeared normal.  The  left lower port site fascia was closed with the Carter-Thomason cone apparatus.  Two separate sutures were placed with good approximation of the fascia.  The patient's abdomen was deflated and all trocars were removed and the skin incisions were closed  with 4-0 Vicryl suture.  Single tooth tenaculum was removed from the cervical lip and good hemostasis was noted.  COMPLICATIONS:  There were no complications.  ESTIMATED BLOOD LOSS:  50 mL.  INTRAOPERATIVE FLUIDS:  700 mL.  URINE OUTPUT:  100 mL.  DISPOSITION:  The patient was taken to recovery room in good condition.  HN/NUANCE  D:08/20/2020 T:08/21/2020 JOB:[id number]

## 2020-08-23 ENCOUNTER — Encounter: Payer: Self-pay | Admitting: Obstetrics and Gynecology

## 2020-08-23 NOTE — Anesthesia Postprocedure Evaluation (Signed)
Anesthesia Post Note  Patient: Olivia Obrien  Procedure(s) Performed: DIAGNOSTIC LAPAROSCOPY WITH REMOVAL OF ECTOPIC PREGNANCY (N/A )  Patient location during evaluation: Other Anesthesia Type: General Level of consciousness: awake and alert Pain management: pain level controlled Vital Signs Assessment: post-procedure vital signs reviewed and stable Respiratory status: spontaneous breathing, nonlabored ventilation and respiratory function stable Cardiovascular status: blood pressure returned to baseline and stable Postop Assessment: no apparent nausea or vomiting Anesthetic complications: no   No complications documented.   Last Vitals:  Vitals:   08/20/20 2321 08/20/20 2335  BP: (!) 127/91 130/79  Pulse: 98 96  Resp: 20 18  Temp: 37.2 C 37.1 C  SpO2:  98%    Last Pain:  Vitals:   08/20/20 2335  TempSrc:   PainSc: 5                  Karleen Hampshire

## 2020-08-24 LAB — SURGICAL PATHOLOGY

## 2020-08-31 ENCOUNTER — Other Ambulatory Visit: Payer: Self-pay | Admitting: Obstetrics and Gynecology

## 2020-08-31 ENCOUNTER — Ambulatory Visit
Admission: RE | Admit: 2020-08-31 | Discharge: 2020-08-31 | Disposition: A | Discharge status: Home / Self Care | Payer: Self-pay | Source: Ambulatory Visit | Attending: Obstetrics and Gynecology | Admitting: Obstetrics and Gynecology

## 2020-08-31 ENCOUNTER — Other Ambulatory Visit: Payer: Self-pay

## 2020-08-31 DIAGNOSIS — R102 Pelvic and perineal pain: Secondary | ICD-10-CM

## 2020-11-05 ENCOUNTER — Emergency Department: Payer: Self-pay

## 2020-11-05 ENCOUNTER — Emergency Department
Admission: EM | Admit: 2020-11-05 | Discharge: 2020-11-06 | Disposition: A | Discharge status: Home / Self Care | Payer: Self-pay | Attending: Emergency Medicine | Admitting: Emergency Medicine

## 2020-11-05 ENCOUNTER — Other Ambulatory Visit: Payer: Self-pay

## 2020-11-05 ENCOUNTER — Encounter: Payer: Self-pay | Admitting: Emergency Medicine

## 2020-11-05 DIAGNOSIS — M545 Low back pain, unspecified: Secondary | ICD-10-CM | POA: Insufficient documentation

## 2020-11-05 DIAGNOSIS — R197 Diarrhea, unspecified: Secondary | ICD-10-CM | POA: Insufficient documentation

## 2020-11-05 DIAGNOSIS — O219 Vomiting of pregnancy, unspecified: Secondary | ICD-10-CM | POA: Insufficient documentation

## 2020-11-05 DIAGNOSIS — F17210 Nicotine dependence, cigarettes, uncomplicated: Secondary | ICD-10-CM | POA: Insufficient documentation

## 2020-11-05 DIAGNOSIS — Z3A01 Less than 8 weeks gestation of pregnancy: Secondary | ICD-10-CM | POA: Insufficient documentation

## 2020-11-05 DIAGNOSIS — R102 Pelvic and perineal pain: Secondary | ICD-10-CM | POA: Insufficient documentation

## 2020-11-05 DIAGNOSIS — M25511 Pain in right shoulder: Secondary | ICD-10-CM | POA: Insufficient documentation

## 2020-11-05 DIAGNOSIS — O26891 Other specified pregnancy related conditions, first trimester: Secondary | ICD-10-CM | POA: Insufficient documentation

## 2020-11-05 HISTORY — DX: Unspecified ectopic pregnancy without intrauterine pregnancy: O00.90

## 2020-11-05 LAB — URINALYSIS, COMPLETE (UACMP) WITH MICROSCOPIC
Bilirubin Urine: NEGATIVE
Glucose, UA: NEGATIVE mg/dL
Hgb urine dipstick: NEGATIVE
Ketones, ur: NEGATIVE mg/dL
Leukocytes,Ua: NEGATIVE
Nitrite: NEGATIVE
Protein, ur: NEGATIVE mg/dL
Specific Gravity, Urine: 1.017 (ref 1.005–1.030)
pH: 6 (ref 5.0–8.0)

## 2020-11-05 LAB — COMPREHENSIVE METABOLIC PANEL
ALT: 16 U/L (ref 0–44)
AST: 13 U/L — ABNORMAL LOW (ref 15–41)
Albumin: 4.2 g/dL (ref 3.5–5.0)
Alkaline Phosphatase: 42 U/L (ref 38–126)
Anion gap: 8 (ref 5–15)
BUN: 11 mg/dL (ref 6–20)
CO2: 21 mmol/L — ABNORMAL LOW (ref 22–32)
Calcium: 9.1 mg/dL (ref 8.9–10.3)
Chloride: 101 mmol/L (ref 98–111)
Creatinine, Ser: 0.65 mg/dL (ref 0.44–1.00)
GFR, Estimated: >60 mL/min (ref >60)
Glucose, Bld: 99 mg/dL (ref 70–99)
Potassium: 3.7 mmol/L (ref 3.5–5.1)
Sodium: 130 mmol/L — ABNORMAL LOW (ref 135–145)
Total Bilirubin: 0.8 mg/dL (ref 0.3–1.2)
Total Protein: 7.1 g/dL (ref 6.5–8.1)

## 2020-11-05 LAB — CBC
HCT: 36.9 % (ref 36.0–46.0)
Hemoglobin: 12.8 g/dL (ref 12.0–15.0)
MCH: 32.6 pg (ref 26.0–34.0)
MCHC: 34.7 g/dL (ref 30.0–36.0)
MCV: 93.9 fL (ref 80.0–100.0)
Platelets: 184 10*3/uL (ref 150–400)
RBC: 3.93 MIL/uL (ref 3.87–5.11)
RDW: 12.1 % (ref 11.5–15.5)
WBC: 18.6 10*3/uL — ABNORMAL HIGH (ref 4.0–10.5)
nRBC: 0 % (ref 0.0–0.2)

## 2020-11-05 LAB — LIPASE, BLOOD: Lipase: 25 U/L (ref 11–51)

## 2020-11-05 LAB — HCG, QUANTITATIVE, PREGNANCY: hCG, Beta Chain, Quant, S: 86322 m[IU]/mL — ABNORMAL HIGH (ref <5)

## 2020-11-05 LAB — WET PREP, GENITAL
Clue Cells Wet Prep HPF POC: NONE SEEN
Sperm: NONE SEEN
Trich, Wet Prep: NONE SEEN
WBC, Wet Prep HPF POC: NONE SEEN
Yeast Wet Prep HPF POC: NONE SEEN

## 2020-11-05 MED ORDER — ONDANSETRON HCL 4 MG/2ML IJ SOLN
4.0000 mg | INTRAMUSCULAR | Status: AC
  Administered 2020-11-05: 4 mg via INTRAVENOUS
  Filled 2020-11-05: qty 2

## 2020-11-05 MED ORDER — HYDROMORPHONE HCL 1 MG/ML IJ SOLN
1.0000 mg | INTRAMUSCULAR | Status: AC
  Administered 2020-11-05: 1 mg via INTRAMUSCULAR
  Filled 2020-11-05: qty 1

## 2020-11-05 MED ORDER — IOHEXOL 300 MG/ML  SOLN
100.0000 mL | Freq: Once | INTRAMUSCULAR | Status: AC | PRN
  Administered 2020-11-05: 100 mL via INTRAVENOUS

## 2020-11-05 NOTE — ED Triage Notes (Signed)
Pt comes into the ED via POV c/o low back pain, right shoulder pain,. Diarrhea, N/V.  Pt states she is about [redacted] weeks pregnant and recently had an ectopic pregnancy in December 2021 where they removed the left fallopian tube.  Pt states the symptoms she is having today is the same as what she had with the ectopic.  Pt is schedule for an abortion tomorrow, but states that the pain has gotten too severe today.  PT ambulatory to triage and in NAD.

## 2020-11-05 NOTE — ED Provider Notes (Signed)
[facility] Emergency Department Provider Note  ____________________________________________  Time seen: Approximately 10:52 PM  I have reviewed the triage vital signs and the nursing notes.   HISTORY  Chief Complaint Back Pain and Shoulder Pain    HPI Olivia Obrien is a 30 y.o. female with a history of ectopic pregnancy 2 months ago managed surgically with removal of the left fallopian tube who comes the ED tonight complaining of pelvic pain radiating to the low back and right shoulder with diarrhea nausea and vomiting.  Symptoms are constant, waxing and waning, no aggravating or alleviating factors.  Patient reports that she is currently [redacted] weeks pregnant.  She has had the nausea and vomiting for the last 3 weeks.  She has scheduled an appointment for termination of this pregnancy tomorrow.  Denies vaginal bleeding or unusual discharge, pain feels severe and similar to symptoms she had from the ectopic pregnancy 2 months ago.  Today the pain is more severe on the right.  Denies lightheadedness or syncope.  No fevers chills chest pain or shortness of breath.      Past Medical History:  Diagnosis Date  . Ectopic pregnancy   . History of pre-eclampsia in prior pregnancy, currently pregnant   . Hx of chlamydia infection   . Medical history non-contributory      Patient Active Problem List   Diagnosis Date Noted  . Ectopic pregnancy 08/20/2020  . Ectopic pregnancy 08/20/2020  . Encounter for induction of labor 06/11/2018  . Supervision of other normal pregnancy, antepartum 10/11/2017     Past Surgical History:  Procedure Laterality Date  . DIAGNOSTIC LAPAROSCOPY WITH REMOVAL OF ECTOPIC PREGNANCY N/A 08/20/2020   Procedure: DIAGNOSTIC LAPAROSCOPY WITH REMOVAL OF ECTOPIC PREGNANCY;  Surgeon: Feliberto Gottron, Ihor Austin, MD;  Location: [facility] ORS;  Service: Gynecology;  Laterality: N/A;  . WISDOM TOOTH EXTRACTION       Prior to Admission medications    Medication Sig Start Date End Date Taking? Authorizing Provider  ibuprofen (ADVIL,MOTRIN) 600 MG tablet Take 1 tablet (600 mg total) by mouth every 6 (six) hours. 06/12/18   Marcelle Overlie, MD  ondansetron (ZOFRAN) 4 MG tablet Take 1 tablet (4 mg total) by mouth daily as needed for nausea or vomiting. 01/02/20 01/01/21  Enid Derry, PA-C  predniSONE (DELTASONE) 50 MG tablet Take 1 tablet (50 mg total) by mouth daily with breakfast. 07/16/18   Cuthriell, Delorise Royals, PA-C     Allergies Mupirocin, Other, Bee venom, and Dermoplast [benzocaine]   Family History  Problem Relation Age of Onset  . Depression Mother   . Hypertension Mother   . Heart disease Father        clot in heart  . Seizures Father   . Tuberculosis Father   . Depression Father   . Heart disease Maternal Grandfather        transplant  . Diabetes Paternal Grandfather     Social History Social History   Tobacco Use  . Smoking status: Never Smoker  . Smokeless tobacco: Never Used  . Tobacco comment: Occasionally 1 cigarette / week  Vaping Use  . Vaping Use: Never used  Substance Use Topics  . Alcohol use: Yes  . Drug use: No    Review of Systems  Constitutional:   No fever or chills.  ENT:   No sore throat. No rhinorrhea. Cardiovascular:   No chest pain or syncope. Respiratory:   No dyspnea or cough. Gastrointestinal: Positive as above for abdominal pain, vomiting and  diarrhea.  Musculoskeletal:   Negative for focal pain or swelling All other systems reviewed and are negative except as documented above in ROS and HPI.  ____________________________________________   PHYSICAL EXAM:  VITAL SIGNS: ED Triage Vitals  Enc Vitals Group     BP 11/05/20 1748 (!) 129/92     Pulse Rate 11/05/20 1748 (!) 103     Resp 11/05/20 1748 16     Temp 11/05/20 1748 99.4 F (37.4 C)     Temp Source 11/05/20 1748 Oral     SpO2 11/05/20 1748 98 %     Weight 11/05/20 1749 127 lb (57.6 kg)     Height 11/05/20 1749 5'  1" (1.549 m)     Head Circumference --      Peak Flow --      Pain Score 11/05/20 1748 10     Pain Loc --      Pain Edu? --      Excl. in GC? --     Vital signs reviewed, nursing assessments reviewed.   Constitutional:   Alert and oriented. Non-toxic appearance. Eyes:   Conjunctivae are normal. EOMI. PERRL. ENT      Head:   Normocephalic and atraumatic.      Nose:   Wearing a mask.      Mouth/Throat:   Wearing a mask.      Neck:   No meningismus. Full ROM. Hematological/Lymphatic/Immunilogical:   No cervical lymphadenopathy. Cardiovascular:   RRR. Symmetric bilateral radial and DP pulses.  No murmurs. Cap refill less than 2 seconds. Respiratory:   Normal respiratory effort without tachypnea/retractions. Breath sounds are clear and equal bilaterally. No wheezes/rales/rhonchi. Gastrointestinal:   Soft with diffuse lower abdominal tenderness, worse on the right. Non distended. There is no CVA tenderness.  No rebound, rigidity, or guarding. Genitourinary:   External exam unremarkable.  Speculum exam shows thick mucoid discharge.  Slight cervical motion tenderness on bimanual exam, normal uterine size, cervical os closed. Musculoskeletal:   Normal range of motion in all extremities. No joint effusions.  No lower extremity tenderness.  No edema. Neurologic:   Normal speech and language.  Motor grossly intact. No acute focal neurologic deficits are appreciated.  Skin:    Skin is warm, dry and intact. No rash noted.  No petechiae, purpura, or bullae.  ____________________________________________    LABS (pertinent positives/negatives) (all labs ordered are listed, but only abnormal results are displayed) Labs Reviewed  COMPREHENSIVE METABOLIC PANEL - Abnormal; Notable for the following components:      Result Value   Sodium 130 (*)    CO2 21 (*)    AST 13 (*)    All other components within normal limits  CBC - Abnormal; Notable for the following components:   WBC 18.6 (*)    All  other components within normal limits  URINALYSIS, COMPLETE (UACMP) WITH MICROSCOPIC - Abnormal; Notable for the following components:   Color, Urine YELLOW (*)    APPearance HAZY (*)    Bacteria, UA RARE (*)    All other components within normal limits  HCG, QUANTITATIVE, PREGNANCY - Abnormal; Notable for the following components:   hCG, Beta Chain, Quant, [place] 44,034 (*)    All other components within normal limits  WET PREP, GENITAL  CHLAMYDIA/NGC RT PCR ([facility] ONLY)  LIPASE, BLOOD  POC URINE PREG, ED   ____________________________________________   EKG    ____________________________________________    RADIOLOGY  No results found.  ____________________________________________   PROCEDURES Procedures  ____________________________________________  DIFFERENTIAL DIAGNOSIS   Miscarriage, ovarian cyst, ectopic pregnancy, appendicitis, STI  CLINICAL IMPRESSION / ASSESSMENT AND PLAN / ED COURSE  Medications ordered in the ED: Medications  HYDROmorphone (DILAUDID) injection 1 mg (1 mg Intramuscular Given 11/05/20 2140)    Pertinent labs & imaging results that were available during my care of the patient were reviewed by me and considered in my medical decision making (see chart for details).  Olivia Obrien was evaluated in Emergency Department on 11/05/2020 for the symptoms described in the history of present illness. She was evaluated in the context of the global COVID-19 pandemic, which necessitated consideration that the patient might be at risk for infection with the SARS-CoV-2 virus that causes COVID-19. Institutional protocols and algorithms that pertain to the evaluation of patients at risk for COVID-19 are in a state of rapid change based on information released by regulatory bodies including the CDC and federal and state organizations. These policies and algorithms were followed during the patient's care in the ED.   Patient presents with severe pelvic  pain is 6 weeks pregnancy, recently had surgery for ectopic pregnancy.  No longer has an IUD.  Vital signs are essentially normal, patient is nontoxic.  GC chlamydia swabs pending.  Pelvic ultrasound pending.  If initial work-up is nondiagnostic, would proceed with CT abdomen pelvis to rule out appendicitis.  Fetal radiation not relevant since she very much intends to terminate pregnancy tomorrow.      ____________________________________________   FINAL CLINICAL IMPRESSION(S) / ED DIAGNOSES    Final diagnoses:  Pelvic pain during pregnancy in first trimester, antepartum     ED Discharge Orders    None      Portions of this note were generated with dragon dictation software. Dictation errors may occur despite best attempts at proofreading.   Sharman Cheek, MD 11/05/20 [phone]

## 2020-11-05 NOTE — ED Notes (Signed)
Pelvic cart at bedside. Provider aware.

## 2020-11-05 NOTE — ED Provider Notes (Signed)
-----------------------------------------   11:08 PM on 11/05/2020 -----------------------------------------  Assuming care from Dr. Scotty Court.  In short, Olivia Obrien is a 30 y.o. female with a chief complaint of pelvic pain during early pregnancy.  Refer to the original H&P for additional details.  The current plan of care is to follow up U/S.  Anticipate need for CT scan if ultrasound is negative.    ----------------------------------------- 2:44 AM on 11/06/2020 -----------------------------------------  The patient's ultrasound was notable for a 77-week-old intrauterine gestation with no acute abnormalities identified.  Dr. Scotty Court order the CT scan of the abdomen pelvis with IV contrast prior to him leaving to rule out appendicitis.  The CT scan demonstrates no evidence of an acute or emergent condition.  I updated the patient.  She was sleeping comfortably I and in no distress.  She is appropriate for discharge and outpatient follow-up later today where she plans to electively terminate her pregnancy.  I gave my usual and customary follow-up recommendations and return precautions.   Loleta Rose, MD 11/06/20 [phone]

## 2020-11-05 NOTE — ED Notes (Signed)
This RN at bedside with Dr.Stafford for pelvic exam.

## 2020-11-05 NOTE — Discharge Instructions (Addendum)
Your work-up today was reassuring.  You can keep your appointment tomorrow for pregnancy termination if that continues to be your wish.  There was no evidence that you have an acute medical condition such as appendicitis.    Return to the emergency department if you develop new or worsening symptoms that concern you.

## 2020-11-06 LAB — CHLAMYDIA/NGC RT PCR (ARMC ONLY)
Chlamydia Tr: NOT DETECTED
N gonorrhoeae: NOT DETECTED

## 2020-11-06 LAB — PREGNANCY, URINE: Preg Test, Ur: POSITIVE — AB

## 2020-11-16 DIAGNOSIS — S0081XA Abrasion of other part of head, initial encounter: Secondary | ICD-10-CM | POA: Diagnosis not present

## 2020-11-16 DIAGNOSIS — Z9181 History of falling: Secondary | ICD-10-CM | POA: Diagnosis not present

## 2020-11-19 DIAGNOSIS — N912 Amenorrhea, unspecified: Secondary | ICD-10-CM | POA: Diagnosis not present

## 2020-11-19 DIAGNOSIS — Z332 Encounter for elective termination of pregnancy: Secondary | ICD-10-CM | POA: Diagnosis not present

## 2020-11-24 DIAGNOSIS — O039 Complete or unspecified spontaneous abortion without complication: Secondary | ICD-10-CM | POA: Diagnosis not present

## 2020-11-24 DIAGNOSIS — O034 Incomplete spontaneous abortion without complication: Secondary | ICD-10-CM | POA: Diagnosis not present

## 2020-11-24 DIAGNOSIS — N711 Chronic inflammatory disease of uterus: Secondary | ICD-10-CM | POA: Diagnosis not present

## 2020-12-30 DIAGNOSIS — J040 Acute laryngitis: Secondary | ICD-10-CM | POA: Diagnosis not present

## 2021-04-08 DIAGNOSIS — Z3202 Encounter for pregnancy test, result negative: Secondary | ICD-10-CM | POA: Diagnosis not present

## 2021-04-08 DIAGNOSIS — R69 Illness, unspecified: Secondary | ICD-10-CM | POA: Diagnosis not present

## 2021-04-13 DIAGNOSIS — H9201 Otalgia, right ear: Secondary | ICD-10-CM | POA: Diagnosis not present

## 2021-11-03 DIAGNOSIS — Z13220 Encounter for screening for lipoid disorders: Secondary | ICD-10-CM | POA: Diagnosis not present

## 2021-11-03 DIAGNOSIS — Z131 Encounter for screening for diabetes mellitus: Secondary | ICD-10-CM | POA: Diagnosis not present

## 2021-11-09 DIAGNOSIS — J069 Acute upper respiratory infection, unspecified: Secondary | ICD-10-CM | POA: Diagnosis not present

## 2021-11-09 DIAGNOSIS — Z8759 Personal history of other complications of pregnancy, childbirth and the puerperium: Secondary | ICD-10-CM | POA: Diagnosis not present

## 2021-11-09 DIAGNOSIS — Z1331 Encounter for screening for depression: Secondary | ICD-10-CM | POA: Diagnosis not present

## 2021-11-09 DIAGNOSIS — I998 Other disorder of circulatory system: Secondary | ICD-10-CM | POA: Diagnosis not present

## 2021-11-09 DIAGNOSIS — Z0000 Encounter for general adult medical examination without abnormal findings: Secondary | ICD-10-CM | POA: Diagnosis not present

## 2022-05-12 DIAGNOSIS — H1031 Unspecified acute conjunctivitis, right eye: Secondary | ICD-10-CM | POA: Diagnosis not present

## 2022-05-19 DIAGNOSIS — B9689 Other specified bacterial agents as the cause of diseases classified elsewhere: Secondary | ICD-10-CM | POA: Diagnosis not present

## 2022-05-19 DIAGNOSIS — J029 Acute pharyngitis, unspecified: Secondary | ICD-10-CM | POA: Diagnosis not present

## 2022-05-19 DIAGNOSIS — B3731 Acute candidiasis of vulva and vagina: Secondary | ICD-10-CM | POA: Diagnosis not present

## 2022-05-19 DIAGNOSIS — H1033 Unspecified acute conjunctivitis, bilateral: Secondary | ICD-10-CM | POA: Diagnosis not present

## 2022-05-19 DIAGNOSIS — J209 Acute bronchitis, unspecified: Secondary | ICD-10-CM | POA: Diagnosis not present

## 2022-05-19 DIAGNOSIS — J019 Acute sinusitis, unspecified: Secondary | ICD-10-CM | POA: Diagnosis not present

## 2022-08-17 DIAGNOSIS — B9689 Other specified bacterial agents as the cause of diseases classified elsewhere: Secondary | ICD-10-CM | POA: Diagnosis not present

## 2022-08-17 DIAGNOSIS — R599 Enlarged lymph nodes, unspecified: Secondary | ICD-10-CM | POA: Diagnosis not present

## 2022-08-17 DIAGNOSIS — N898 Other specified noninflammatory disorders of vagina: Secondary | ICD-10-CM | POA: Diagnosis not present

## 2022-08-17 DIAGNOSIS — N926 Irregular menstruation, unspecified: Secondary | ICD-10-CM | POA: Diagnosis not present

## 2022-08-19 ENCOUNTER — Emergency Department: Payer: 59

## 2022-08-19 ENCOUNTER — Other Ambulatory Visit: Payer: Self-pay

## 2022-08-19 ENCOUNTER — Emergency Department
Admission: EM | Admit: 2022-08-19 | Discharge: 2022-08-19 | Disposition: A | Discharge status: Home / Self Care | Payer: 59 | Attending: Emergency Medicine | Admitting: Emergency Medicine

## 2022-08-19 DIAGNOSIS — T368X5A Adverse effect of other systemic antibiotics, initial encounter: Secondary | ICD-10-CM | POA: Insufficient documentation

## 2022-08-19 DIAGNOSIS — R55 Syncope and collapse: Secondary | ICD-10-CM

## 2022-08-19 DIAGNOSIS — R531 Weakness: Secondary | ICD-10-CM | POA: Diagnosis not present

## 2022-08-19 DIAGNOSIS — R200 Anesthesia of skin: Secondary | ICD-10-CM | POA: Diagnosis not present

## 2022-08-19 DIAGNOSIS — R42 Dizziness and giddiness: Secondary | ICD-10-CM | POA: Diagnosis not present

## 2022-08-19 DIAGNOSIS — E860 Dehydration: Secondary | ICD-10-CM

## 2022-08-19 DIAGNOSIS — T50905A Adverse effect of unspecified drugs, medicaments and biological substances, initial encounter: Secondary | ICD-10-CM

## 2022-08-19 LAB — URINALYSIS, ROUTINE W REFLEX MICROSCOPIC
Bacteria, UA: NONE SEEN
Bilirubin Urine: NEGATIVE
Glucose, UA: NEGATIVE mg/dL
Hgb urine dipstick: NEGATIVE
Ketones, ur: 5 mg/dL — AB
Nitrite: NEGATIVE
Protein, ur: NEGATIVE mg/dL
Specific Gravity, Urine: 1.019 (ref 1.005–1.030)
pH: 6 (ref 5.0–8.0)

## 2022-08-19 LAB — CBC
HCT: 43.6 % (ref 36.0–46.0)
Hemoglobin: 14.2 g/dL (ref 12.0–15.0)
MCH: 30.7 pg (ref 26.0–34.0)
MCHC: 32.6 g/dL (ref 30.0–36.0)
MCV: 94.2 fL (ref 80.0–100.0)
Platelets: 197 K/uL (ref 150–400)
RBC: 4.63 MIL/uL (ref 3.87–5.11)
RDW: 12.1 % (ref 11.5–15.5)
WBC: 8.9 K/uL (ref 4.0–10.5)
nRBC: 0 % (ref 0.0–0.2)

## 2022-08-19 LAB — COMPREHENSIVE METABOLIC PANEL
ALT: 14 U/L (ref 0–44)
AST: 15 U/L (ref 15–41)
Albumin: 4.5 g/dL (ref 3.5–5.0)
Alkaline Phosphatase: 42 U/L (ref 38–126)
Anion gap: 5 (ref 5–15)
BUN: 16 mg/dL (ref 6–20)
CO2: 25 mmol/L (ref 22–32)
Calcium: 9.4 mg/dL (ref 8.9–10.3)
Chloride: 107 mmol/L (ref 98–111)
Creatinine, Ser: 0.73 mg/dL (ref 0.44–1.00)
GFR, Estimated: >60 mL/min (ref >60)
Glucose, Bld: 106 mg/dL — ABNORMAL HIGH (ref 70–99)
Potassium: 4 mmol/L (ref 3.5–5.1)
Sodium: 137 mmol/L (ref 135–145)
Total Bilirubin: 1.4 mg/dL — ABNORMAL HIGH (ref 0.3–1.2)
Total Protein: 7.6 g/dL (ref 6.5–8.1)

## 2022-08-19 LAB — LIPASE, BLOOD: Lipase: 33 U/L (ref 11–51)

## 2022-08-19 LAB — TROPONIN I (HIGH SENSITIVITY): Troponin I (High Sensitivity): <2 ng/L (ref <18)

## 2022-08-19 LAB — POC URINE PREG, ED: Preg Test, Ur: NEGATIVE

## 2022-08-19 MED ORDER — SODIUM CHLORIDE 0.9 % IV BOLUS
1000.0000 mL | Freq: Once | INTRAVENOUS | Status: AC
  Administered 2022-08-19: 1000 mL via INTRAVENOUS

## 2022-08-19 MED ORDER — CLINDAMYCIN PHOSPHATE 2 % VA CREA: 1.0000 | TOPICAL_CREAM | Freq: Every day | VAGINAL | 0 refills | Status: AC

## 2022-08-19 MED ORDER — KETOROLAC TROMETHAMINE 30 MG/ML IJ SOLN
15.0000 mg | Freq: Once | INTRAMUSCULAR | Status: AC
  Administered 2022-08-19: 15 mg via INTRAVENOUS
  Filled 2022-08-19: qty 1

## 2022-08-19 NOTE — ED Provider Triage Note (Signed)
Emergency Medicine Provider Triage Evaluation Note  Olivia Obrien , a 31 y.o. female  was evaluated in triage.  Pt complains of headache, syncopal episode in the shower, on Flagyl for bacterial vaginosis..  Review of Systems  Positive:  Negative:   Physical Exam  BP (!) 140/92 (BP Location: Left Arm)   Pulse (!) 105   Temp 98.5 F (36.9 C) (Oral)   Resp 18   Ht 5\' 1"  (1.549 m)   Wt 59 kg   SpO2 99%   BMI 24.56 kg/m  Gen:   Awake, no distress   Resp:  Normal effort  MSK:   Moves extremities without difficulty  Other:    Medical Decision Making  Medically screening exam initiated at 12:15 PM.  Appropriate orders placed.  M Colasanti was informed that the remainder of the evaluation will be completed by another provider, this initial triage assessment does not replace that evaluation, and the importance of remaining in the ED until their evaluation is complete.     Valentina Lucks, PA-C 08/19/22 1216

## 2022-08-19 NOTE — Discharge Instructions (Addendum)
STOP the flagyl  Start the clindamycin cream  Drink plenty of fluids

## 2022-08-19 NOTE — ED Provider Notes (Signed)
[facility] Provider Note    Event Date/Time   First MD Initiated Contact with Patient 08/19/22 1332     (approximate)   History   Abdominal Pain and Loss of Consciousness  d HPI  Olivia Obrien is a 31 y.o. female  here with multiple complaints. Pt's primary complaint is dizziness, syncopal episode, diffuse tingling in  hands/feet with numbness, and some mild generalized abd discomfort. Pt states that she was seen and started on flagyl for BV recently - this is a recurrent issue. The next day, she began to feel fatigued, nauseous, with a nasty taste in her mouth and numbness intermittently in her b/l hands and feet. She's had poor appetite since starting it. Earlier today she was in the shower when she began to feel weak/lightheaded. She called her husband to come help and when he arrived she appeared pale, staring off, saying she didn't feel bad. Her eyes then rolled back and she was unconcsious with brief shaking, then immediate return to consciousness asking what had happened. No prolonged poscictal phase. No other complaints. No fevers, chills.      Physical Exam   Triage Vital Signs: ED Triage Vitals  Enc Vitals Group     BP 08/19/22 1214 (!) 140/92     Pulse Rate 08/19/22 1214 (!) 105     Resp 08/19/22 1214 18     Temp 08/19/22 1214 98.5 F (36.9 C)     Temp Source 08/19/22 1214 Oral     SpO2 08/19/22 1214 99 %     Weight 08/19/22 1212 130 lb (59 kg)     Height 08/19/22 1212 5\' 1"  (1.549 m)     Head Circumference --      Peak Flow --      Pain Score 08/19/22 1212 7     Pain Loc --      Pain Edu? --      Excl. in GC? --     Most recent vital signs: Vitals:   08/19/22 1556 08/19/22 1600  BP:  (!) 119/93  Pulse:  74  Resp:    Temp: 98.5 F (36.9 C)   SpO2:  100%     General: Awake, no distress.  CV:  Good peripheral perfusion. RRR. No murmurs, rubs, gallops. Resp:  Normal effort. Lungs CTAB. Abd:  No distention. Minimal  suprapubic TTP without rebound or guarding.  Other:  CNII-XII intact. Normal sensation to light touch bl UE and LE. Strength 5/5 UE and LE. Normal gait.   ED Results / Procedures / Treatments   Labs (all labs ordered are listed, but only abnormal results are displayed) Labs Reviewed  COMPREHENSIVE METABOLIC PANEL - Abnormal; Notable for the following components:      Result Value   Glucose, Bld 106 (*)    Total Bilirubin 1.4 (*)    All other components within normal limits  URINALYSIS, ROUTINE W REFLEX MICROSCOPIC - Abnormal; Notable for the following components:   Color, Urine YELLOW (*)    APPearance CLEAR (*)    Ketones, ur 5 (*)    Leukocytes,Ua TRACE (*)    All other components within normal limits  LIPASE, BLOOD  CBC  POC URINE PREG, ED  TROPONIN I (HIGH SENSITIVITY)  TROPONIN I (HIGH SENSITIVITY)     EKG Sinus tachycardia, VR 108. PR 128, QRS 80, QTc 426. No acute st elevations or depressions. No ischemia or infarct.   RADIOLOGY CT Head: NAICA   I also  independently reviewed and agree with radiologist interpretations.   PROCEDURES:  Critical Care performed: No   MEDICATIONS ORDERED IN ED: Medications  sodium chloride 0.9 % bolus 1,000 mL (0 mLs Intravenous Stopped 08/19/22 1524)  ketorolac (TORADOL) 30 MG/ML injection 15 mg (15 mg Intravenous Given 08/19/22 1524)  sodium chloride 0.9 % bolus 1,000 mL (0 mLs Intravenous Stopped 08/19/22 1613)     IMPRESSION / MDM / ASSESSMENT AND PLAN / ED COURSE  I reviewed the triage vital signs and the nursing notes.                              Differential diagnosis includes, but is not limited to, medication adverse effect, anxiety, orthostasis/hypotension, electrolyte abnormality, anemia.  Patient's presentation is most consistent with acute presentation with potential threat to life or bodily function.  31 yo well appearing female here with generalized weakness, dizziness, and syncopal episode. EKG nonischemic,  no arrhythmia noted and sx seem orthostatic in nature - doubt arrhythmia, ACS, PE. She had sx onset after starting flagyl and this could be related. Labs o/w reassuring. UA + ketones c/w dehydration but no signs of uti.  UPT is negative. CMP unremarkable. CBC without leukocytosis or anemia. Pt given IVF and feels much better.  No apparent emergent pathology. Sx seem to correlate with starting flagyl so will d/c this and switch to topical clinda. Otherwise, no apparent emergent pathology. EKG nonischemic and she has no arrhythmia here. Ambulatory w/o difficulty. Will encourage hydration, d/c home.  FINAL CLINICAL IMPRESSION(S) / ED DIAGNOSES   Final diagnoses:  Dehydration  Syncope, unspecified syncope type  Adverse effect of drug, initial encounter     Rx / DC Orders   ED Discharge Orders          Ordered    clindamycin (CLEOCIN) 2 % vaginal cream  Daily at bedtime        08/19/22 1559             Note:  This document was prepared using Dragon voice recognition software and may include unintentional dictation errors.   Duffy Bruce, MD 08/19/22 2238

## 2022-08-19 NOTE — ED Triage Notes (Signed)
Pt states she was started on a new medicine on Thursday (metronidazole) for BV- pt states she feels weak, dizzy, numbness in her hands and feet, and had a syncopal episode- pt states she also has abd pain

## 2023-01-31 DIAGNOSIS — Z3480 Encounter for supervision of other normal pregnancy, unspecified trimester: Secondary | ICD-10-CM | POA: Diagnosis not present

## 2023-01-31 DIAGNOSIS — Z3201 Encounter for pregnancy test, result positive: Secondary | ICD-10-CM | POA: Diagnosis not present

## 2023-01-31 DIAGNOSIS — Z3481 Encounter for supervision of other normal pregnancy, first trimester: Secondary | ICD-10-CM | POA: Diagnosis not present

## 2023-01-31 LAB — OB RESULTS CONSOLE RUBELLA ANTIBODY, IGM: Rubella: NON-IMMUNE/NOT IMMUNE

## 2023-01-31 LAB — OB RESULTS CONSOLE HIV ANTIBODY (ROUTINE TESTING): HIV: NONREACTIVE

## 2023-01-31 LAB — OB RESULTS CONSOLE RPR: RPR: NONREACTIVE

## 2023-01-31 LAB — OB RESULTS CONSOLE HEPATITIS B SURFACE ANTIGEN: Hepatitis B Surface Ag: NEGATIVE

## 2023-01-31 LAB — HEPATITIS C ANTIBODY: HCV Ab: NEGATIVE

## 2023-03-19 LAB — OB RESULTS CONSOLE GC/CHLAMYDIA
Chlamydia: NEGATIVE
Neisseria Gonorrhea: NEGATIVE

## 2023-09-04 LAB — OB RESULTS CONSOLE GBS: GBS: POSITIVE

## 2023-09-12 NOTE — L&D Delivery Note (Signed)
 OB/GYN [facility] Delivery Note  Olivia Obrien is a 33 y.o. H5E7987 s/p VD at [redacted]w[redacted]d. She was admitted for IOL 2/2 DFM.   ROM: 5h 5m with clear fluid GBS Status: Pos, PCN ppx   Maximum Maternal Temperature: 98.2  Labor Progress: Initial SVE: 0.5/thick/ballotable. She then progressed to complete.   Delivery Date/Time: 09/19/2023 @1114  Delivery: Called to room and patient was complete and pushing. Head delivered LOA with left compound hand. A loose nuchal and body cord x1 was present. Shoulder and body delivered in usual fashion. Infant with spontaneous cry, placed on mother's abdomen, dried and stimulated. Cord clamped x 2 after 1-minute delay, and cut by FOB. Cord blood drawn. Placenta delivered spontaneously with gentle cord traction. Fundus firm with massage and Pitocin . TXA given for brisk uterine bleeding. Labia, perineum, vagina, and cervix inspected. There were no lacerations to repair.  Baby Weight: pending  Placenta: 3 vessel, intact. Sent to L&D Complications: None Lacerations: None EBL: 350 mL Analgesia: Epidural   Infant:  APGAR (1 MIN): 8 APGAR (5 MINS): 9  [name], DO 09/19/2023, 11:28 AM

## 2023-09-14 NOTE — H&P (Signed)
 HPI: 33 y.o. [id number] @ [redacted]w[redacted]d estimated gestational age (as dated by LMP c/w 8 week ultrasound) presents for induction of labor for markedly decreased fetal movement x 2 days. Reports not feeling fetal movement for 3 hours yesterday, even with mindfulness/rest. Discussed patient's case with MFM (Dr. Arna) who recommends delivery given this significant decrease in FM. NST reactive in the office - patient only felt one movement.  Leakage of fluid:  No Vaginal bleeding:  No Contractions:  No Fetal movement:  Significantly decreased  Prenatal care has been provided by Dr. Pamela Maddy S. [facility]).  ROS:  Denies fevers, chills, chest pain, visual changes, SOB, RUQ/epigastric pain, N/V, dysuria, hematuria, or sudden onset/worsening bilateral LE or facial edema.  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 (acetowhite at 4 o'clock), patient defers biopsies at this time, ECC negative) History of HTN postpartum (ASA 81mg  daily) Rubella non-immune Allergy to Dermaplast  Prenatal Transfer Tool  Maternal Diabetes: No Genetic Screening: Normal - Low risk FEMALE Maternal Ultrasounds/Referrals: Normal Fetal Ultrasounds or other Referrals:  Other: Serial growth US  Maternal Substance Abuse:  No Significant Maternal Medications:  None Significant Maternal Lab Results: Group B Strep positive   Prenatal Labs Blood type:  A Positive Antibody screen:  Negative CBC:  H/H 12.1/35.1 Rubella: Immune RPR:  Non-reactive Hep B:  Negative Hep C:  Negative HIV:  Negative GC/CT:  Negative Glucola:  125.9 (WNL)  Immunizations: Tdap: Given prenatally Flu: Declines RSV: Declines  Contraceptive plan: Undecided  OBHx:  OB History     Gravida  3   Para  2   Term  2   Preterm      AB      Living  2      SAB      IAB      Ectopic      Multiple  0   Live Births  2          PMHx:  See  above Meds:  PNV, ASA 81mg  daily Allergy:   Allergies  Allergen Reactions   Mupirocin  Itching, Rash and Other (See Comments)    Burning of the skin   Other Shortness Of Breath, Itching, Swelling and Rash    Hair dye- caused severe reactions   Bee Venom Swelling    At the site of sting   Dermoplast [Benzocaine ] Rash   SurgHx:  Past Surgical History:  Procedure Laterality Date   DIAGNOSTIC LAPAROSCOPY WITH REMOVAL OF ECTOPIC PREGNANCY N/A 08/20/2020   Procedure: DIAGNOSTIC LAPAROSCOPY WITH REMOVAL OF ECTOPIC PREGNANCY;  Surgeon: Lovetta Debby PARAS, MD;  Location: [facility] ORS;  Service: Gynecology;  Laterality: N/A;   WISDOM TOOTH EXTRACTION     SocHx:   Denies Tobacco, ETOH, illicit drugs  O: There were no vitals taken for this visit. Gen. AAOx3, NAD CV.  RRR  Resp. CTAB, no wheezes/rales/rhonchi Abd. Gravid, soft, non-tender throughout, no rebound/guarding Extr.  No bilateral LE edema, no calf tenderness bilaterally SVE: 0.5/thick/high, posterior    Last US :  [redacted]w[redacted]d, EFW 3221g, 7lb 2oz (63%), AAFV, cephalic, anterior placenta, placenta grade 3  NST: Reactive (09/18/2023)  Labs: see orders  A/P:  33 y.o. H6E7997 @ [redacted]w[redacted]d who is admitted for induction of labor for significantly decreased fetal movement.  - Admit to L&D - Admit labs (CBC, T&S, RPR) - CEFM/Toco - FWB:  NST Reactive - Diet:  Clear liquids - IVF:  LR at 125cc/hour -  VTE Prophylaxis:  SCDs - GBS Status:  Positive - PCN ordered - Presentation:  Confirm prior to IOL - Pain control:  Per patient request - Induction method:  Cytotec  for cervical ripening - Anticipate SVD  Eleanor Jury, DO

## 2023-09-18 ENCOUNTER — Inpatient Hospital Stay (HOSPITAL_COMMUNITY): Payer: Commercial Managed Care - PPO | Admitting: Anesthesiology

## 2023-09-18 ENCOUNTER — Encounter (HOSPITAL_COMMUNITY): Payer: Self-pay

## 2023-09-18 ENCOUNTER — Inpatient Hospital Stay (HOSPITAL_COMMUNITY)
Admission: AD | Admit: 2023-09-18 | Discharge: 2023-09-20 | DRG: 807 | Disposition: A | Discharge status: Home / Self Care | Payer: Commercial Managed Care - PPO | Attending: Obstetrics and Gynecology | Admitting: Obstetrics and Gynecology

## 2023-09-18 ENCOUNTER — Other Ambulatory Visit: Payer: Self-pay

## 2023-09-18 DIAGNOSIS — O368130 Decreased fetal movements, third trimester, not applicable or unspecified: Principal | ICD-10-CM | POA: Diagnosis present

## 2023-09-18 DIAGNOSIS — O368190 Decreased fetal movements, unspecified trimester, not applicable or unspecified: Principal | ICD-10-CM | POA: Diagnosis present

## 2023-09-18 DIAGNOSIS — Z3A38 38 weeks gestation of pregnancy: Secondary | ICD-10-CM | POA: Diagnosis not present

## 2023-09-18 DIAGNOSIS — O99824 Streptococcus B carrier state complicating childbirth: Secondary | ICD-10-CM | POA: Diagnosis present

## 2023-09-18 DIAGNOSIS — Z23 Encounter for immunization: Secondary | ICD-10-CM | POA: Diagnosis not present

## 2023-09-18 LAB — CBC
HCT: 34.8 % — ABNORMAL LOW (ref 36.0–46.0)
Hemoglobin: 11.5 g/dL — ABNORMAL LOW (ref 12.0–15.0)
MCH: 29.6 pg (ref 26.0–34.0)
MCHC: 33 g/dL (ref 30.0–36.0)
MCV: 89.7 fL (ref 80.0–100.0)
Platelets: 212 10*3/uL (ref 150–400)
RBC: 3.88 MIL/uL (ref 3.87–5.11)
RDW: 12.8 % (ref 11.5–15.5)
WBC: 11.3 10*3/uL — ABNORMAL HIGH (ref 4.0–10.5)
nRBC: 0 % (ref 0.0–0.2)

## 2023-09-18 LAB — TYPE AND SCREEN
ABO/RH(D): A POS
Antibody Screen: NEGATIVE

## 2023-09-18 MED ORDER — LIDOCAINE-EPINEPHRINE (PF) 2 %-1:200000 IJ SOLN
INTRAMUSCULAR | Status: DC | PRN
  Administered 2023-09-18: 5 mL via EPIDURAL

## 2023-09-18 MED ORDER — EPHEDRINE 5 MG/ML INJ
10.0000 mg | INTRAVENOUS | Status: DC | PRN
  Filled 2023-09-18: qty 5

## 2023-09-18 MED ORDER — EPHEDRINE 5 MG/ML INJ: 10.0000 mg | INTRAVENOUS | Status: DC | PRN

## 2023-09-18 MED ORDER — LACTATED RINGERS IV SOLN
500.0000 mL | INTRAVENOUS | Status: DC | PRN
  Administered 2023-09-18: 1000 mL via INTRAVENOUS

## 2023-09-18 MED ORDER — MISOPROSTOL 25 MCG QUARTER TABLET
25.0000 ug | ORAL_TABLET | ORAL | Status: AC | PRN
  Administered 2023-09-18 – 2023-09-19 (×4): 25 ug via VAGINAL
  Filled 2023-09-18 (×4): qty 1

## 2023-09-18 MED ORDER — FENTANYL-BUPIVACAINE-NACL 0.5-0.125-0.9 MG/250ML-% EP SOLN
12.0000 mL/h | EPIDURAL | Status: DC | PRN
Start: 2023-09-18 — End: 2023-09-19
  Administered 2023-09-18: 12 mL/h via EPIDURAL
  Filled 2023-09-18: qty 250

## 2023-09-18 MED ORDER — DIPHENHYDRAMINE HCL 50 MG/ML IJ SOLN
12.5000 mg | INTRAMUSCULAR | Status: DC | PRN
  Administered 2023-09-18: 12.5 mg via INTRAVENOUS
  Filled 2023-09-18: qty 1

## 2023-09-18 MED ORDER — OXYTOCIN-SODIUM CHLORIDE 30-0.9 UT/500ML-% IV SOLN: 2.5000 [IU]/h | INTRAVENOUS | Status: DC

## 2023-09-18 MED ORDER — OXYCODONE-ACETAMINOPHEN 5-325 MG PO TABS: 1.0000 | ORAL_TABLET | ORAL | Status: DC | PRN

## 2023-09-18 MED ORDER — ACETAMINOPHEN 325 MG PO TABS: 650.0000 mg | ORAL_TABLET | ORAL | Status: DC | PRN

## 2023-09-18 MED ORDER — OXYCODONE-ACETAMINOPHEN 5-325 MG PO TABS: 2.0000 | ORAL_TABLET | ORAL | Status: DC | PRN

## 2023-09-18 MED ORDER — PHENYLEPHRINE 80 MCG/ML (10ML) SYRINGE FOR IV PUSH (FOR BLOOD PRESSURE SUPPORT)
80.0000 ug | PREFILLED_SYRINGE | INTRAVENOUS | Status: DC | PRN
  Administered 2023-09-18: 80 ug via INTRAVENOUS
  Filled 2023-09-18: qty 10

## 2023-09-18 MED ORDER — ONDANSETRON HCL 4 MG/2ML IJ SOLN
4.0000 mg | Freq: Four times a day (QID) | INTRAMUSCULAR | Status: DC | PRN
  Administered 2023-09-18: 4 mg via INTRAVENOUS
  Filled 2023-09-18: qty 2

## 2023-09-18 MED ORDER — LIDOCAINE HCL (PF) 1 % IJ SOLN: 30.0000 mL | INTRAMUSCULAR | Status: DC | PRN

## 2023-09-18 MED ORDER — SOD CITRATE-CITRIC ACID 500-334 MG/5ML PO SOLN: 30.0000 mL | ORAL | Status: DC | PRN

## 2023-09-18 MED ORDER — PENICILLIN G POT IN DEXTROSE 60000 UNIT/ML IV SOLN
3.0000 10*6.[IU] | INTRAVENOUS | Status: DC
  Administered 2023-09-18 – 2023-09-19 (×4): 3 10*6.[IU] via INTRAVENOUS
  Filled 2023-09-18 (×4): qty 50

## 2023-09-18 MED ORDER — TERBUTALINE SULFATE 1 MG/ML IJ SOLN: 0.2500 mg | Freq: Once | INTRAMUSCULAR | Status: DC | PRN

## 2023-09-18 MED ORDER — LACTATED RINGERS IV SOLN
500.0000 mL | Freq: Once | INTRAVENOUS | Status: AC
  Administered 2023-09-18: 500 mL via INTRAVENOUS

## 2023-09-18 MED ORDER — PHENYLEPHRINE 80 MCG/ML (10ML) SYRINGE FOR IV PUSH (FOR BLOOD PRESSURE SUPPORT)
80.0000 ug | PREFILLED_SYRINGE | INTRAVENOUS | Status: AC | PRN
  Administered 2023-09-18 (×3): 80 ug via INTRAVENOUS

## 2023-09-18 MED ORDER — BUPIVACAINE HCL (PF) 0.25 % IJ SOLN
INTRAMUSCULAR | Status: DC | PRN
  Administered 2023-09-18 (×2): 5 mL via EPIDURAL

## 2023-09-18 MED ORDER — SODIUM CHLORIDE 0.9 % IV SOLN
5.0000 10*6.[IU] | Freq: Once | INTRAVENOUS | Status: AC
  Administered 2023-09-18: 5 10*6.[IU] via INTRAVENOUS
  Filled 2023-09-18: qty 5

## 2023-09-18 MED ORDER — LACTATED RINGERS IV SOLN: INTRAVENOUS | Status: DC

## 2023-09-18 MED ORDER — FENTANYL CITRATE (PF) 100 MCG/2ML IJ SOLN: 50.0000 ug | INTRAMUSCULAR | Status: DC | PRN

## 2023-09-18 MED ORDER — OXYTOCIN BOLUS FROM INFUSION: 333.0000 mL | Freq: Once | INTRAVENOUS | Status: DC

## 2023-09-18 MED ORDER — OXYTOCIN-SODIUM CHLORIDE 30-0.9 UT/500ML-% IV SOLN
1.0000 m[IU]/min | INTRAVENOUS | Status: DC
  Administered 2023-09-19: 2 m[IU]/min via INTRAVENOUS
  Filled 2023-09-18: qty 500

## 2023-09-18 NOTE — Anesthesia Preprocedure Evaluation (Signed)
 Anesthesia Evaluation  Patient identified by MRN, date of birth, ID band Patient awake    Reviewed: Allergy & Precautions, NPO status , Patient's Chart, lab work & pertinent test results, reviewed documented beta blocker date and time   History of Anesthesia Complications Negative for: history of anesthetic complications  Airway Mallampati: II  TM Distance: >3 FB     Dental no notable dental hx.    Pulmonary neg COPD, neg PE   breath sounds clear to auscultation       Cardiovascular (-) hypertension(-) CAD, (-) Past MI, (-) Cardiac Stents, (-) CABG and (-) Orthopnea  Rhythm:Regular Rate:Normal     Neuro/Psych neg Seizures    GI/Hepatic ,neg GERD  ,,(+) neg Cirrhosis        Endo/Other  neg diabetes    Renal/GU Renal disease     Musculoskeletal   Abdominal   Peds  Hematology   Anesthesia Other Findings   Reproductive/Obstetrics (+) Pregnancy                             Anesthesia Physical Anesthesia Plan  ASA: 2  Anesthesia Plan: Epidural   Post-op Pain Management:    Induction: Intravenous  PONV Risk Score and Plan: 2 and Ondansetron   Airway Management Planned:   Additional Equipment:   Intra-op Plan:   Post-operative Plan:   Informed Consent: I have reviewed the patients History and Physical, chart, labs and discussed the procedure including the risks, benefits and alternatives for the proposed anesthesia with the patient or authorized representative who has indicated his/her understanding and acceptance.       Plan Discussed with:   Anesthesia Plan Comments:        Anesthesia Quick Evaluation

## 2023-09-18 NOTE — Anesthesia Procedure Notes (Signed)
 Epidural Patient location during procedure: OB Start time: 09/18/2023 6:15 PM End time: 09/18/2023 6:22 PM  Staffing Anesthesiologist: Keneth Lynwood POUR, MD Performed: anesthesiologist   Preanesthetic Checklist Completed: patient identified, IV checked, site marked, risks and benefits discussed, surgical consent, monitors and equipment checked, pre-op evaluation and timeout performed  Epidural Patient position: sitting Prep: DuraPrep Patient monitoring: heart rate, continuous pulse ox and blood pressure Approach: midline Location: L4-L5 Injection technique: LOR saline  Needle:  Needle type: Tuohy  Needle gauge: 17 G Needle length: 9 cm Needle insertion depth: 5 cm Catheter type: closed end flexible Catheter size: 19 Gauge Catheter at skin depth: 10 cm Test dose: negative and 1.5% lidocaine  with Epi 1:200 K  Assessment Events: blood not aspirated, no cerebrospinal fluid, injection not painful, no injection resistance, no paresthesia and negative IV test  Additional Notes Reason for block:procedure for pain

## 2023-09-18 NOTE — Progress Notes (Signed)
 OB Progress Note  S: Patient resting - feeling some discomfort with contractions. May desire epidural after next Cytotec  placement.  O: BP 129/82   Pulse 87   Temp 98.1 F (36.7 C) (Oral)   Ht 5' 1 (1.549 m)   Wt 79.6 kg   BMI 33.14 kg/m   FHT: 135bpm, moderate variablity, + accels, - decels Toco: q1-2.5 minutes SVE: 0.5/thick/ballotable at 1441 by primary RN  A/P: 33 y.o. H5E7987 @ [redacted]w[redacted]d admitted for induction of labor for significantly decreased fetal movement.  FWB: Cat. I Labor course: S/p Cytotec  25mcg vaginally Pain: Per patient request GBS: Positive - s/p one dose of PCN Anticipate SVD  Jadae Steinke, DO

## 2023-09-19 ENCOUNTER — Encounter (HOSPITAL_COMMUNITY): Payer: Self-pay | Admitting: Obstetrics and Gynecology

## 2023-09-19 LAB — RPR: RPR Ser Ql: NONREACTIVE

## 2023-09-19 MED ORDER — WITCH HAZEL-GLYCERIN EX PADS: 1.0000 | MEDICATED_PAD | CUTANEOUS | Status: DC | PRN

## 2023-09-19 MED ORDER — IBUPROFEN 600 MG PO TABS
600.0000 mg | ORAL_TABLET | Freq: Four times a day (QID) | ORAL | Status: DC
  Administered 2023-09-19 – 2023-09-20 (×4): 600 mg via ORAL
  Filled 2023-09-19 (×4): qty 1

## 2023-09-19 MED ORDER — COCONUT OIL OIL: 1.0000 | TOPICAL_OIL | Status: DC | PRN

## 2023-09-19 MED ORDER — TRANEXAMIC ACID-NACL 1000-0.7 MG/100ML-% IV SOLN: 1000.0000 mg | INTRAVENOUS | Status: DC

## 2023-09-19 MED ORDER — SENNOSIDES-DOCUSATE SODIUM 8.6-50 MG PO TABS
2.0000 | ORAL_TABLET | Freq: Every day | ORAL | Status: DC
Start: 2023-09-20 — End: 2023-09-20
  Administered 2023-09-20: 2 via ORAL
  Filled 2023-09-19: qty 2

## 2023-09-19 MED ORDER — ONDANSETRON HCL 4 MG/2ML IJ SOLN
4.0000 mg | INTRAMUSCULAR | Status: DC | PRN
Start: 2023-09-19 — End: 2023-09-20

## 2023-09-19 MED ORDER — DIBUCAINE (PERIANAL) 1 % EX OINT: 1.0000 | TOPICAL_OINTMENT | CUTANEOUS | Status: DC | PRN

## 2023-09-19 MED ORDER — SIMETHICONE 80 MG PO CHEW: 80.0000 mg | CHEWABLE_TABLET | ORAL | Status: DC | PRN

## 2023-09-19 MED ORDER — ZOLPIDEM TARTRATE 5 MG PO TABS: 5.0000 mg | ORAL_TABLET | Freq: Every evening | ORAL | Status: DC | PRN

## 2023-09-19 MED ORDER — ONDANSETRON HCL 4 MG PO TABS
4.0000 mg | ORAL_TABLET | ORAL | Status: DC | PRN
Start: 2023-09-19 — End: 2023-09-20

## 2023-09-19 MED ORDER — DIPHENHYDRAMINE HCL 25 MG PO CAPS: 25.0000 mg | ORAL_CAPSULE | Freq: Four times a day (QID) | ORAL | Status: DC | PRN

## 2023-09-19 MED ORDER — ACETAMINOPHEN 325 MG PO TABS
650.0000 mg | ORAL_TABLET | ORAL | Status: DC | PRN
  Administered 2023-09-19 (×2): 650 mg via ORAL
  Filled 2023-09-19 (×2): qty 2

## 2023-09-19 MED ORDER — OXYCODONE HCL 5 MG PO TABS
5.0000 mg | ORAL_TABLET | ORAL | Status: DC | PRN
  Administered 2023-09-20 (×2): 5 mg via ORAL
  Filled 2023-09-19 (×2): qty 1

## 2023-09-19 MED ORDER — TETANUS-DIPHTH-ACELL PERTUSSIS 5-2.5-18.5 LF-MCG/0.5 IM SUSY
0.5000 mL | PREFILLED_SYRINGE | Freq: Once | INTRAMUSCULAR | Status: DC
  Filled 2023-09-19: qty 0.5

## 2023-09-19 MED ORDER — PRENATAL MULTIVITAMIN CH
1.0000 | ORAL_TABLET | Freq: Every day | ORAL | Status: DC
  Administered 2023-09-19 – 2023-09-20 (×2): 1 via ORAL
  Filled 2023-09-19 (×2): qty 1

## 2023-09-19 MED ORDER — TRANEXAMIC ACID-NACL 1000-0.7 MG/100ML-% IV SOLN
INTRAVENOUS | Status: AC
  Administered 2023-09-19: 1000 mg via INTRAVENOUS
  Filled 2023-09-19: qty 100

## 2023-09-19 NOTE — Plan of Care (Signed)
  Problem: Education: Goal: Knowledge of General Education information will improve Description: Including pain rating scale, medication(s)/side effects and non-pharmacologic comfort measures Outcome: Progressing   Problem: Health Behavior/Discharge Planning: Goal: Ability to manage health-related needs will improve Outcome: Progressing   Problem: Clinical Measurements: Goal: Ability to maintain clinical measurements within normal limits will improve Outcome: Progressing Goal: Will remain free from infection Outcome: Progressing Goal: Diagnostic test results will improve Outcome: Progressing Goal: Respiratory complications will improve Outcome: Progressing Goal: Cardiovascular complication will be avoided Outcome: Progressing   Problem: Activity: Goal: Risk for activity intolerance will decrease Outcome: Progressing   Problem: Nutrition: Goal: Adequate nutrition will be maintained Outcome: Progressing   Problem: Coping: Goal: Level of anxiety will decrease Outcome: Progressing   Problem: Elimination: Goal: Will not experience complications related to bowel motility Outcome: Progressing Goal: Will not experience complications related to urinary retention Outcome: Progressing   Problem: Pain Management: Goal: General experience of comfort will improve Outcome: Progressing   Problem: Safety: Goal: Ability to remain free from injury will improve Outcome: Progressing   Problem: Skin Integrity: Goal: Risk for impaired skin integrity will decrease Outcome: Progressing   Problem: Education: Goal: Knowledge of Childbirth will improve Outcome: Progressing Goal: Ability to make informed decisions regarding treatment and plan of care will improve Outcome: Progressing Goal: Ability to state and carry out methods to decrease the pain will improve Outcome: Progressing Goal: Individualized Educational Video(s) Outcome: Progressing   Problem: Coping: Goal: Ability to  verbalize concerns and feelings about labor and delivery will improve Outcome: Progressing   Problem: Life Cycle: Goal: Ability to make normal progression through stages of labor will improve Outcome: Progressing Goal: Ability to effectively push during vaginal delivery will improve Outcome: Progressing   Problem: Role Relationship: Goal: Will demonstrate positive interactions with the child Outcome: Progressing   Problem: Safety: Goal: Risk of complications during labor and delivery will decrease Outcome: Progressing   Problem: Pain Management: Goal: Relief or control of pain from uterine contractions will improve Outcome: Progressing

## 2023-09-19 NOTE — Progress Notes (Signed)
 Labor Progress Note Olivia  AVE Obrien is a 32 y.o. H5E7987 at [redacted]w[redacted]d presented for IOL 2/2 DFM  S: No acute concerns. She is resting.   O:  BP 115/80   Pulse 68   Temp 98.2 F (36.8 C) (Oral)   Resp 20   Ht 5' 1 (1.549 m)   Wt 79.6 kg   SpO2 99%   BMI 33.14 kg/m  EFM: 125bpm/moderate/+accels, no decels Toco: every 1-2 mins  CVE: Dilation: 2 Effacement (%): 60 Cervical Position: Middle Station: -3 Presentation: Vertex Exam by:: Cay, RN   A&P: 33 y.o. H5E7987 [redacted]w[redacted]d here for IOL 2/2 DFM, as discussed with MFM.  #Labor: Progressing well. S/p 25mcg vaginal cytotec  x4 overnight and SROM clear fluid @0612 . Started on pit this morning @0707  currently on 2.  #Pain: Epidural #FWB: Cat I  #GBS positive, PCN ppx  Hx of PPHTN -Continue to monitor especially in the PP period.   Rubella NI -MMR pp  Olivia Sacramento, DO 8:39 AM

## 2023-09-20 LAB — CBC
HCT: 29.8 % — ABNORMAL LOW (ref 36.0–46.0)
Hemoglobin: 9.8 g/dL — ABNORMAL LOW (ref 12.0–15.0)
MCH: 29.8 pg (ref 26.0–34.0)
MCHC: 32.9 g/dL (ref 30.0–36.0)
MCV: 90.6 fL (ref 80.0–100.0)
Platelets: 180 10*3/uL (ref 150–400)
RBC: 3.29 MIL/uL — ABNORMAL LOW (ref 3.87–5.11)
RDW: 13 % (ref 11.5–15.5)
WBC: 9.3 10*3/uL (ref 4.0–10.5)
nRBC: 0 % (ref 0.0–0.2)

## 2023-09-20 MED ORDER — IBUPROFEN 600 MG PO TABS: 600.0000 mg | ORAL_TABLET | Freq: Four times a day (QID) | ORAL | 1 refills | Status: AC | PRN

## 2023-09-20 MED ORDER — MEASLES, MUMPS & RUBELLA VAC IJ SOLR
0.5000 mL | Freq: Once | INTRAMUSCULAR | Status: AC
  Administered 2023-09-20: 0.5 mL via SUBCUTANEOUS
  Filled 2023-09-20: qty 0.5

## 2023-09-20 NOTE — Progress Notes (Signed)
 Postpartum Note Day #1  S:  Patient doing well.  Pain controlled.  Tolerating regular diet.   Ambulating and voiding without difficulty. Desires discharge home today. Denies fevers, chills, chest pain, SOB, N/V, or worsening bilateral LE edema.  Lochia: Minimal Infant feeding:  Breast Circumcision:  Desires prior to discharge if feasible  Contraception:  Undecided  O: Temp:  [97.6 F (36.4 C)-98.2 F (36.8 C)] 97.7 F (36.5 C) (01/09 0508) Pulse Rate:  [61-97] 61 (01/09 0508) Resp:  [18] 18 (01/09 0508) BP: (111-134)/(69-86) 130/82 (01/09 0508) SpO2:  [98 %-100 %] 98 % (01/09 0508) Gen: NAD, pleasant and cooperative CV: Regular rate Resp: Normal work of breathing Abdomen: soft, non-distended, non-tender throughout Uterus: firm, non-tender, below umbilicus Ext: No bilateral LE edema, no bilateral calf tenderness  Labs:  Recent Labs    09/18/23 1403 09/20/23 0529  HGB 11.5* 9.8*  HCT 34.8* 29.8*    A/P: Patient is a 33 y.o. H5E6986 PPD#1 s/p SVD.  - Pain well controlled  - GU: UOP is adequate - GI: Tolerating regular diet - Activity: encouraged sitting up to chair and ambulation as tolerated - DVT Prophylaxis: Ambulation, SCDs in bed - Labs: as above  Circumcision Consent:  Routine circumcisions performed on newborns have been identified as voluntary, elective procedures by [employer] such as the [employer].  It is considered an elective procedure with no definitive medical indication and carries risks. Risks include but are not limited to bleeding, infection, damage to penis with possible need for further surgery, poor cosmesis, and local anesthetic risks. Circumcision will only be performed if patient is deemed to have normal anatomy by his Pediatrician, meets adequate criteria for a newborn of similar gestational age after birth and is without infection or other medical issue contraindicating an elective procedure. Patient understands and  agrees. Patient discussed with parents of infant.  There was concern regarding infant's anatomy (curved penile shaft, more pronounced with suprapubic pressure). I counseled the parents that I would evaluate this afternoon and if unable to perform circumcision, recommend outpatient circumcision with Pediatric Urology.  Disposition:  D/C home today if infant is discharged.   Cheyrl Buley, DO

## 2023-09-20 NOTE — Anesthesia Postprocedure Evaluation (Signed)
 Anesthesia Post Note  Patient: Olivia Obrien  CHRISTELLA Lewis  Procedure(s) Performed: AN AD HOC LABOR EPIDURAL     Anesthesia Type: Epidural Level of consciousness: awake, oriented and awake and alert Pain management: pain level controlled Vital Signs Assessment: post-procedure vital signs reviewed and stable Respiratory status: spontaneous breathing, respiratory function stable and nonlabored ventilation Cardiovascular status: stable Postop Assessment: no headache, adequate PO intake, able to ambulate, patient able to bend at knees and no apparent nausea or vomiting Anesthetic complications: no   No notable events documented.  Last Vitals:  Vitals:   09/19/23 2115 09/20/23 0508  BP: 129/80 130/82  Pulse:  61  Resp: 18 18  Temp:  36.5 C  SpO2: 100% 98%    Last Pain:  Vitals:   09/20/23 0848  TempSrc:   PainSc: 0-No pain   Pain Goal:                   Ahlia Lemanski

## 2023-09-20 NOTE — Discharge Instructions (Signed)
 WHAT TO LOOK OUT FOR: Fever of 100.4 or above Mastitis: feels like flu and breasts hurt Infection: increased pain, swelling or redness Blood clots golf ball size or larger Postpartum depression   Congratulations on your newest addition!

## 2023-09-20 NOTE — Plan of Care (Signed)
  Problem: Education: Goal: Knowledge of General Education information will improve Description: Including pain rating scale, medication(s)/side effects and non-pharmacologic comfort measures Outcome: Progressing   Problem: Health Behavior/Discharge Planning: Goal: Ability to manage health-related needs will improve Outcome: Progressing   Problem: Clinical Measurements: Goal: Ability to maintain clinical measurements within normal limits will improve Outcome: Progressing Goal: Will remain free from infection Outcome: Progressing Goal: Diagnostic test results will improve Outcome: Progressing Goal: Respiratory complications will improve Outcome: Progressing Goal: Cardiovascular complication will be avoided Outcome: Progressing   Problem: Activity: Goal: Risk for activity intolerance will decrease Outcome: Progressing   Problem: Nutrition: Goal: Adequate nutrition will be maintained Outcome: Progressing   Problem: Coping: Goal: Level of anxiety will decrease Outcome: Progressing   Problem: Elimination: Goal: Will not experience complications related to bowel motility Outcome: Progressing Goal: Will not experience complications related to urinary retention Outcome: Progressing   Problem: Pain Management: Goal: General experience of comfort will improve Outcome: Progressing   Problem: Safety: Goal: Ability to remain free from injury will improve Outcome: Progressing   Problem: Skin Integrity: Goal: Risk for impaired skin integrity will decrease Outcome: Progressing   Problem: Education: Goal: Knowledge of Childbirth will improve Outcome: Progressing Goal: Ability to make informed decisions regarding treatment and plan of care will improve Outcome: Progressing Goal: Ability to state and carry out methods to decrease the pain will improve Outcome: Progressing Goal: Individualized Educational Video(s) Outcome: Progressing   Problem: Coping: Goal: Ability to  verbalize concerns and feelings about labor and delivery will improve Outcome: Progressing   Problem: Life Cycle: Goal: Ability to make normal progression through stages of labor will improve Outcome: Progressing Goal: Ability to effectively push during vaginal delivery will improve Outcome: Progressing   Problem: Role Relationship: Goal: Will demonstrate positive interactions with the child Outcome: Progressing   Problem: Safety: Goal: Risk of complications during labor and delivery will decrease Outcome: Progressing   Problem: Pain Management: Goal: Relief or control of pain from uterine contractions will improve Outcome: Progressing   Problem: Education: Goal: Knowledge of condition will improve Outcome: Progressing Goal: Individualized Educational Video(s) Outcome: Progressing Goal: Individualized Newborn Educational Video(s) Outcome: Progressing   Problem: Activity: Goal: Will verbalize the importance of balancing activity with adequate rest periods Outcome: Progressing Goal: Ability to tolerate increased activity will improve Outcome: Progressing   Problem: Coping: Goal: Ability to identify and utilize available resources and services will improve Outcome: Progressing   Problem: Life Cycle: Goal: Chance of risk for complications during the postpartum period will decrease Outcome: Progressing   Problem: Role Relationship: Goal: Ability to demonstrate positive interaction with newborn will improve Outcome: Progressing   Problem: Skin Integrity: Goal: Demonstration of wound healing without infection will improve Outcome: Progressing

## 2023-09-20 NOTE — Plan of Care (Signed)
 Problem: Education: Goal: Knowledge of General Education information will improve Description: Including pain rating scale, medication(s)/side effects and non-pharmacologic comfort measures 09/20/2023 0850 by Madison Rosina LABOR, LPN Outcome: Adequate for Discharge 09/20/2023 0750 by Madison Rosina LABOR, LPN Outcome: Progressing   Problem: Health Behavior/Discharge Planning: Goal: Ability to manage health-related needs will improve 09/20/2023 0850 by Madison Rosina LABOR, LPN Outcome: Adequate for Discharge 09/20/2023 0750 by Madison Rosina LABOR, LPN Outcome: Progressing   Problem: Clinical Measurements: Goal: Ability to maintain clinical measurements within normal limits will improve 09/20/2023 0850 by Madison Rosina LABOR, LPN Outcome: Adequate for Discharge 09/20/2023 0750 by Madison Rosina LABOR, LPN Outcome: Progressing Goal: Will remain free from infection 09/20/2023 0850 by Madison Rosina LABOR, LPN Outcome: Adequate for Discharge 09/20/2023 0750 by Madison Rosina LABOR, LPN Outcome: Progressing Goal: Diagnostic test results will improve 09/20/2023 0850 by Madison Rosina LABOR, LPN Outcome: Adequate for Discharge 09/20/2023 0750 by Madison Rosina LABOR, LPN Outcome: Progressing Goal: Respiratory complications will improve 09/20/2023 0850 by Madison Rosina LABOR, LPN Outcome: Adequate for Discharge 09/20/2023 0750 by Madison Rosina LABOR, LPN Outcome: Progressing Goal: Cardiovascular complication will be avoided 09/20/2023 0850 by Madison Rosina LABOR, LPN Outcome: Adequate for Discharge 09/20/2023 0750 by Madison Rosina LABOR, LPN Outcome: Progressing   Problem: Activity: Goal: Risk for activity intolerance will decrease 09/20/2023 0850 by Madison Rosina LABOR, LPN Outcome: Adequate for Discharge 09/20/2023 0750 by Madison Rosina LABOR, LPN Outcome: Progressing   Problem: Nutrition: Goal: Adequate nutrition will be maintained 09/20/2023 0850 by Madison Rosina LABOR, LPN Outcome: Adequate for Discharge 09/20/2023 0750 by Madison Rosina LABOR, LPN Outcome: Progressing   Problem:  Coping: Goal: Level of anxiety will decrease 09/20/2023 0850 by Madison Rosina LABOR, LPN Outcome: Adequate for Discharge 09/20/2023 0750 by Madison Rosina LABOR, LPN Outcome: Progressing   Problem: Elimination: Goal: Will not experience complications related to bowel motility 09/20/2023 0850 by Madison Rosina LABOR, LPN Outcome: Adequate for Discharge 09/20/2023 0750 by Madison Rosina LABOR, LPN Outcome: Progressing Goal: Will not experience complications related to urinary retention 09/20/2023 0850 by Madison Rosina LABOR, LPN Outcome: Adequate for Discharge 09/20/2023 0750 by Madison Rosina LABOR, LPN Outcome: Progressing   Problem: Pain Management: Goal: General experience of comfort will improve 09/20/2023 0850 by Madison Rosina LABOR, LPN Outcome: Adequate for Discharge 09/20/2023 0750 by Madison Rosina LABOR, LPN Outcome: Progressing   Problem: Safety: Goal: Ability to remain free from injury will improve 09/20/2023 0850 by Madison Rosina LABOR, LPN Outcome: Adequate for Discharge 09/20/2023 0750 by Madison Rosina LABOR, LPN Outcome: Progressing   Problem: Skin Integrity: Goal: Risk for impaired skin integrity will decrease 09/20/2023 0850 by Madison Rosina LABOR, LPN Outcome: Adequate for Discharge 09/20/2023 0750 by Madison Rosina LABOR, LPN Outcome: Progressing   Problem: Education: Goal: Knowledge of Childbirth will improve 09/20/2023 0850 by Madison Rosina LABOR, LPN Outcome: Adequate for Discharge 09/20/2023 0750 by Madison Rosina LABOR, LPN Outcome: Progressing Goal: Ability to make informed decisions regarding treatment and plan of care will improve 09/20/2023 0850 by Madison Rosina LABOR, LPN Outcome: Adequate for Discharge 09/20/2023 0750 by Madison Rosina LABOR, LPN Outcome: Progressing Goal: Ability to state and carry out methods to decrease the pain will improve 09/20/2023 0850 by Madison Rosina LABOR, LPN Outcome: Adequate for Discharge 09/20/2023 0750 by Madison Rosina LABOR, LPN Outcome: Progressing Goal: Individualized Educational Video(s) 09/20/2023 0850 by Madison Rosina LABOR,  LPN Outcome: Adequate for Discharge 09/20/2023 0750 by Madison Rosina LABOR, LPN Outcome: Progressing   Problem: Coping: Goal: Ability to verbalize concerns and feelings  about labor and delivery will improve 09/20/2023 0850 by Madison Rosina LABOR, LPN Outcome: Adequate for Discharge 09/20/2023 0750 by Madison Rosina LABOR, LPN Outcome: Progressing   Problem: Life Cycle: Goal: Ability to make normal progression through stages of labor will improve 09/20/2023 0850 by Madison Rosina LABOR, LPN Outcome: Adequate for Discharge 09/20/2023 0750 by Madison Rosina LABOR, LPN Outcome: Progressing Goal: Ability to effectively push during vaginal delivery will improve 09/20/2023 0850 by Madison Rosina LABOR, LPN Outcome: Adequate for Discharge 09/20/2023 0750 by Madison Rosina LABOR, LPN Outcome: Progressing   Problem: Role Relationship: Goal: Will demonstrate positive interactions with the child 09/20/2023 0850 by Madison Rosina LABOR, LPN Outcome: Adequate for Discharge 09/20/2023 0750 by Madison Rosina LABOR, LPN Outcome: Progressing   Problem: Safety: Goal: Risk of complications during labor and delivery will decrease 09/20/2023 0850 by Madison Rosina LABOR, LPN Outcome: Adequate for Discharge 09/20/2023 0750 by Madison Rosina LABOR, LPN Outcome: Progressing   Problem: Pain Management: Goal: Relief or control of pain from uterine contractions will improve 09/20/2023 0850 by Madison Rosina LABOR, LPN Outcome: Adequate for Discharge 09/20/2023 0750 by Madison Rosina LABOR, LPN Outcome: Progressing   Problem: Education: Goal: Knowledge of condition will improve 09/20/2023 0850 by Madison Rosina LABOR, LPN Outcome: Adequate for Discharge 09/20/2023 0750 by Madison Rosina LABOR, LPN Outcome: Progressing Goal: Individualized Educational Video(s) 09/20/2023 0850 by Madison Rosina LABOR, LPN Outcome: Adequate for Discharge 09/20/2023 0750 by Madison Rosina LABOR, LPN Outcome: Progressing Goal: Individualized Newborn Educational Video(s) 09/20/2023 0850 by Madison Rosina LABOR, LPN Outcome: Adequate for  Discharge 09/20/2023 0750 by Madison Rosina LABOR, LPN Outcome: Progressing   Problem: Activity: Goal: Will verbalize the importance of balancing activity with adequate rest periods 09/20/2023 0850 by Madison Rosina LABOR, LPN Outcome: Adequate for Discharge 09/20/2023 0750 by Madison Rosina LABOR, LPN Outcome: Progressing Goal: Ability to tolerate increased activity will improve 09/20/2023 0850 by Madison Rosina LABOR, LPN Outcome: Adequate for Discharge 09/20/2023 0750 by Madison Rosina LABOR, LPN Outcome: Progressing   Problem: Coping: Goal: Ability to identify and utilize available resources and services will improve 09/20/2023 0850 by Madison Rosina LABOR, LPN Outcome: Adequate for Discharge 09/20/2023 0750 by Madison Rosina LABOR, LPN Outcome: Progressing   Problem: Life Cycle: Goal: Chance of risk for complications during the postpartum period will decrease 09/20/2023 0850 by Madison Rosina LABOR, LPN Outcome: Adequate for Discharge 09/20/2023 0750 by Madison Rosina LABOR, LPN Outcome: Progressing   Problem: Role Relationship: Goal: Ability to demonstrate positive interaction with newborn will improve 09/20/2023 0850 by Madison Rosina LABOR, LPN Outcome: Adequate for Discharge 09/20/2023 0750 by Madison Rosina LABOR, LPN Outcome: Progressing   Problem: Skin Integrity: Goal: Demonstration of wound healing without infection will improve 09/20/2023 0850 by Madison Rosina LABOR, LPN Outcome: Adequate for Discharge 09/20/2023 0750 by Madison Rosina LABOR, LPN Outcome: Progressing

## 2023-09-20 NOTE — Discharge Summary (Signed)
 Postpartum Discharge Summary  Date of Service: 09/20/23     Patient Name: Olivia Obrien DOB: September 28, 1990 MRN: [id number]  Date of admission: 09/18/2023 Delivery date:09/19/2023 Delivering provider: LETHA SAVANT Date of discharge: 09/20/2023  Admitting diagnosis: Decreased fetal movement [O36.8190] Intrauterine pregnancy: [redacted]w[redacted]d     Secondary diagnosis:  Principal Problem:   Decreased fetal movement  Additional problems: Size greater than dates, ASCUS/HRHPV positive pap smear, history of HTN postpartum, Rubella non-immune    Discharge diagnosis: Term Pregnancy Delivered                                              Post partum procedures: None Augmentation: Pitocin  and Cytotec  Complications: None  Hospital course: Induction of Labor With Vaginal Delivery   33 y.o. yo [id number] at [redacted]w[redacted]d was admitted to the hospital 09/18/2023 for induction of labor.  Indication for induction:  Decreased fetal movement (significantly decreased) .  Patient had an labor course complicated by nothing. Membrane Rupture Time/Date: 6:12 AM,09/19/2023  Delivery Method:Vaginal, Spontaneous Operative Delivery:N/A Episiotomy: None Lacerations:  None Details of delivery can be found in separate delivery note.  Patient had a postpartum course complicated by nothing. Patient is discharged home 09/20/23.  Newborn Data: Birth date:09/19/2023 Birth time:11:14 AM Gender:Female Living status:Living Apgars:8 ,9  Weight:3540 g  Magnesium Sulfate received: No BMZ received: No Rhophylac:N/A FFM:Nmizmzi prior to discharge T-DaP:Given prenatally Flu: No RSV Vaccine received: No Transfusion:No Immunizations administered: Immunization History  Administered Date(s) Administered   DTaP 06/15/2014   Influenza,inj,Quad PF,6+ Mos 10/11/2017   MMR 08/17/2014    Physical exam  Vitals:   09/19/23 1400 09/19/23 1745 09/19/23 2115 09/20/23 0508  BP: 126/84 119/86 129/80 130/82  Pulse: 67 62  61  Resp: 18 18 18 18   Temp:  98.2 F (36.8 C) 97.6 F (36.4 C)  97.7 F (36.5 C)  TempSrc: Oral Axillary  Oral  SpO2: 98% 98% 100% 98%  Weight:      Height:       General: alert, cooperative, and no distress Lochia: appropriate Uterine Fundus: firm Incision: N/A DVT Evaluation: No evidence of DVT seen on physical exam. No significant calf/ankle edema. Labs: Lab Results  Component Value Date   WBC 9.3 09/20/2023   HGB 9.8 (L) 09/20/2023   HCT 29.8 (L) 09/20/2023   MCV 90.6 09/20/2023   PLT 180 09/20/2023      Latest Ref Rng & Units 08/19/2022   12:15 PM  CMP  Glucose 70 - 99 mg/dL 893   BUN 6 - 20 mg/dL 16   Creatinine 9.55 - 1.00 mg/dL 9.26   Sodium 864 - 854 mmol/L 137   Potassium 3.5 - 5.1 mmol/L 4.0   Chloride 98 - 111 mmol/L 107   CO2 22 - 32 mmol/L 25   Calcium 8.9 - 10.3 mg/dL 9.4   Total Protein 6.5 - 8.1 g/dL 7.6   Total Bilirubin 0.3 - 1.2 mg/dL 1.4   Alkaline Phos 38 - 126 U/L 42   AST 15 - 41 U/L 15   ALT 0 - 44 U/L 14    Edinburgh Score:    09/19/2023    2:00 PM  Edinburgh Postnatal Depression Scale Screening Tool  I have been able to laugh and see the funny side of things. 0  I have looked forward with enjoyment to things. 0  I have blamed myself unnecessarily  when things went wrong. 0  I have been anxious or worried for no good reason. 0  I have felt scared or panicky for no good reason. 0  Things have been getting on top of me. 0  I have been so unhappy that I have had difficulty sleeping. 0  I have felt sad or miserable. 0  I have been so unhappy that I have been crying. 0  The thought of harming myself has occurred to me. 0  Edinburgh Postnatal Depression Scale Total 0      After visit meds:  Allergies as of 09/20/2023       Reactions   Mupirocin  Itching, Rash, Other (See Comments)   Burning of the skin   Other Shortness Of Breath, Itching, Swelling, Rash   Hair dye- caused severe reactions   Bee Venom Swelling   At the site of sting   Dermoplast [benzocaine ]  Rash        Medication List     STOP taking these medications    predniSONE  50 MG tablet Commonly known as: DELTASONE        TAKE these medications    ibuprofen  600 MG tablet Commonly known as: ADVIL  Take 1 tablet (600 mg total) by mouth every 6 (six) hours as needed for moderate pain (pain score 4-6) or cramping. What changed:  when to take this reasons to take this         Discharge home in stable condition Infant Feeding: Bottle Infant Disposition:home with mother Discharge instruction: per After Visit Summary and Postpartum booklet. Activity: Advance as tolerated. Pelvic rest for 6 weeks.  Diet: routine diet Anticipated Birth Control: Unsure Postpartum Appointment:6 weeks Additional Postpartum F/U:  None Future Appointments:No future appointments. Follow up Visit:  Follow-up Information     Storm Setter, DO Follow up in 6 week(s).   Specialty: Obstetrics and Gynecology Why: Please keep your 6 week postpartum visit as previously scheduled. Contact information: [address] [place] [address] [phone]                     09/20/2023 Setter Storm, DO

## 2023-09-29 ENCOUNTER — Telehealth (HOSPITAL_COMMUNITY): Payer: Self-pay

## 2023-09-29 NOTE — Telephone Encounter (Signed)
09/29/2023 1727  Name: Olivia Obrien MRN: [id number] DOB: [birth year]  Reason for Call:  Transition of Care Hospital Discharge Call  Contact Status: Patient Contact Status: Message  Language assistant needed:          Follow-Up Questions:    Inocente Salles Postnatal Depression Scale:  In the Past 7 Days:    PHQ2-9 Depression Scale:     Discharge Follow-up:    Post-discharge interventions: NA  Signature  Signe Colt

## 2023-10-07 ENCOUNTER — Inpatient Hospital Stay (HOSPITAL_COMMUNITY): Payer: Commercial Managed Care - PPO

## 2023-11-21 ENCOUNTER — Ambulatory Visit: Admitting: Podiatry
